# Patient Record
Sex: Male | Born: 1965
Health system: Southern US, Community
[De-identification: ages and names within clinical notes are randomized; demographics above are authoritative.]

## PROBLEM LIST (undated history)

## (undated) DIAGNOSIS — J342 Deviated nasal septum: Secondary | ICD-10-CM

## (undated) DIAGNOSIS — G4730 Sleep apnea, unspecified: Secondary | ICD-10-CM

## (undated) DIAGNOSIS — M1990 Unspecified osteoarthritis, unspecified site: Secondary | ICD-10-CM

## (undated) DIAGNOSIS — K219 Gastro-esophageal reflux disease without esophagitis: Secondary | ICD-10-CM

## (undated) HISTORY — PX: WISDOM TOOTH EXTRACTION: SHX21

## (undated) HISTORY — DX: Sleep apnea, unspecified: G47.30

## (undated) HISTORY — PX: NASAL SEPTUM SURGERY: SHX37

## (undated) HISTORY — DX: Unspecified osteoarthritis, unspecified site: M19.90

## (undated) HISTORY — DX: Gastro-esophageal reflux disease without esophagitis: K21.9

---

## 2005-05-30 ENCOUNTER — Encounter: Admission: RE | Admit: 2005-05-30 | Discharge: 2005-05-30 | Payer: Self-pay | Admitting: Emergency Medicine

## 2007-06-24 ENCOUNTER — Encounter: Admission: RE | Admit: 2007-06-24 | Discharge: 2007-06-24 | Payer: Self-pay | Admitting: Emergency Medicine

## 2009-07-04 ENCOUNTER — Ambulatory Visit: Payer: Self-pay | Admitting: Internal Medicine

## 2009-07-04 ENCOUNTER — Encounter: Payer: Self-pay | Admitting: Internal Medicine

## 2009-07-04 DIAGNOSIS — K219 Gastro-esophageal reflux disease without esophagitis: Secondary | ICD-10-CM | POA: Insufficient documentation

## 2009-07-12 ENCOUNTER — Telehealth: Payer: Self-pay | Admitting: Internal Medicine

## 2009-11-22 ENCOUNTER — Ambulatory Visit: Payer: Self-pay | Admitting: Internal Medicine

## 2009-11-22 DIAGNOSIS — N529 Male erectile dysfunction, unspecified: Secondary | ICD-10-CM | POA: Insufficient documentation

## 2009-11-22 LAB — CONVERTED CEMR LAB
ALT: 31 units/L (ref 0–53)
Alkaline Phosphatase: 45 units/L (ref 39–117)
Basophils Absolute: 0 10*3/uL (ref 0.0–0.1)
Basophils Relative: 0.3 % (ref 0.0–3.0)
Bilirubin Urine: NEGATIVE
Bilirubin, Direct: 0.2 mg/dL (ref 0.0–0.3)
Cholesterol: 206 mg/dL — ABNORMAL HIGH (ref 0–200)
GFR calc non Af Amer: 85.75 mL/min (ref >60)
HCT: 47.5 % (ref 39.0–52.0)
Lymphocytes Relative: 19.8 % (ref 12.0–46.0)
Lymphs Abs: 1.1 10*3/uL (ref 0.7–4.0)
MCV: 102.3 fL — ABNORMAL HIGH (ref 78.0–100.0)
Neutro Abs: 4 10*3/uL (ref 1.4–7.7)
Neutrophils Relative %: 69.4 % (ref 43.0–77.0)
Platelets: 162 10*3/uL (ref 150.0–400.0)
RBC: 4.64 M/uL (ref 4.22–5.81)
RDW: 12.1 % (ref 11.5–14.6)
Specific Gravity, Urine: 1.015 (ref 1.000–1.030)
Total Bilirubin: 1.2 mg/dL (ref 0.3–1.2)
Total Protein, Urine: NEGATIVE mg/dL
Total Protein: 7.3 g/dL (ref 6.0–8.3)
Urobilinogen, UA: 0.2 (ref 0.0–1.0)
WBC: 5.7 10*3/uL (ref 4.5–10.5)
pH: 6.5 (ref 5.0–8.0)

## 2009-11-23 ENCOUNTER — Encounter: Payer: Self-pay | Admitting: Internal Medicine

## 2009-12-27 ENCOUNTER — Ambulatory Visit: Payer: Self-pay | Admitting: Internal Medicine

## 2009-12-27 DIAGNOSIS — B029 Zoster without complications: Secondary | ICD-10-CM | POA: Insufficient documentation

## 2010-02-21 ENCOUNTER — Ambulatory Visit: Payer: Self-pay | Admitting: Internal Medicine

## 2010-02-24 ENCOUNTER — Ambulatory Visit: Payer: Self-pay | Admitting: Internal Medicine

## 2010-05-19 ENCOUNTER — Ambulatory Visit: Payer: Self-pay | Admitting: Internal Medicine

## 2010-07-28 ENCOUNTER — Ambulatory Visit: Payer: Self-pay | Admitting: Internal Medicine

## 2010-08-01 ENCOUNTER — Encounter: Payer: Self-pay | Admitting: Internal Medicine

## 2010-08-03 ENCOUNTER — Telehealth: Payer: Self-pay | Admitting: Internal Medicine

## 2010-09-18 ENCOUNTER — Ambulatory Visit: Payer: Self-pay | Admitting: Internal Medicine

## 2011-01-02 NOTE — Assessment & Plan Note (Signed)
Summary: knot on neck/cd   Vital Signs:  Patient profile:   45 year old male Height:      70 inches Weight:      177 pounds O2 Sat:      98 % on Room air Temp:     98.0 degrees F oral Pulse rate:   66 / minute Pulse rhythm:   regular Resp:     16 per minute BP sitting:   130 / 88  (left arm) Cuff size:   large  Vitals Entered By: Rock Nephew CMA (December 27, 2009 10:02 AM)  O2 Flow:  Room air  Primary Care Provider:  Etta Grandchild MD   History of Present Illness: He returns c/o a 5 day hx. of painful, burning blisters on his chin down to the right side under his right jaw with fatigue and lymphadenopathy in the same area.  Preventive Screening-Counseling & Management  Alcohol-Tobacco     Alcohol drinks/day: <1     Alcohol type: spirits     >5/day in last 3 mos: no     Alcohol Counseling: not indicated; use of alcohol is not excessive or problematic     Feels need to cut down: no     Feels annoyed by complaints: no     Feels guilty re: drinking: no     Needs 'eye opener' in am: no     Smoking Status: never  Hep-HIV-STD-Contraception     Hepatitis Risk: no risk noted     HIV Risk: no risk noted     STD Risk: no risk noted     SBE monthly: yes     SBE Education/Counseling: to perform regular SBE      Sexual History:  currently monogamous.        Drug Use:  never.        Blood Transfusions:  no.    Medications Prior to Update: 1)  Prilosec 2)  Cialis 20 Mg Tabs (Tadalafil) .... Take One By Mouth Q 3-4 Days As Directed  Current Medications (verified): 1)  Prilosec 2)  Cialis 20 Mg Tabs (Tadalafil) .... Take One By Mouth Q 3-4 Days As Directed  Allergies (verified): No Known Drug Allergies  Past History:  Past Medical History: Reviewed history from 07/04/2009 and no changes required. GERD  Past Surgical History: Reviewed history from 07/04/2009 and no changes required. Denies surgical history  Family History: Reviewed history from 07/04/2009  and no changes required. Family History of Arthritis  Social History: Reviewed history from 07/04/2009 and no changes required. 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  The patient denies anorexia, fever, weight loss, chest pain, headaches, and abdominal pain.   General:  Complains of chills, fatigue, and malaise; denies fever, loss of appetite, sweats, weakness, and weight loss.  Physical Exam  General:  alert, well-developed, well-nourished, well-hydrated, appropriate dress, normal appearance, healthy-appearing, cooperative to examination, and good hygiene.   Head:  normocephalic, atraumatic, no abnormalities observed, and no abnormalities palpated.   Eyes:  No corneal or conjunctival inflammation noted. EOMI. Perrla. Funduscopic exam benign, without hemorrhages, exudates or papilledema. Vision grossly normal. Ears:  R ear normal and L ear normal.   Nose:  External nasal examination shows no deformity or inflammation. Nasal mucosa are pink and moist without lesions or exudates. Mouth:  Oral mucosa  and oropharynx without lesions or exudates.  Teeth in good repair. Neck:  he has mild right sided sub-mandibular LAD. Lungs:  Normal respiratory effort, chest expands symmetrically. Lungs are clear to auscultation, no crackles or wheezes. Heart:  Normal rate and regular rhythm. S1 and S2 normal without gallop, murmur, click, rub or other extra sounds. Abdomen:  soft, non-tender, normal bowel sounds, no distention, no masses, no guarding, no hepatomegaly, and no splenomegaly.   Msk:  normal ROM, no joint tenderness, no joint swelling, no joint warmth, no redness over joints, no joint deformities, no joint instability, and no crepitation.   Pulses:  R and L carotid,radial,femoral,dorsalis pedis and posterior tibial pulses are full  and equal bilaterally Extremities:  No clubbing, cyanosis, edema, or deformity noted with normal full range of motion of all joints.   Neurologic:  No cranial nerve deficits noted. Station and gait are normal. Plantar reflexes are down-going bilaterally. DTRs are symmetrical throughout. Sensory, motor and coordinative functions appear intact. Skin:  there are 6 excoriated vesicles with an erythematous base on the right side of the chin extending under the right sub-mandibular region with a faint yellow crust on 3 of the lesions. there is no induration, fluctuance, exudate, streaking, or pustules. Cervical Nodes:  no anterior cervical adenopathy and no posterior cervical adenopathy.   Axillary Nodes:  no R axillary adenopathy and no L axillary adenopathy.   Psych:  Cognition and judgment appear intact. Alert and cooperative with normal attention span and concentration. No apparent delusions, illusions, hallucinations   Impression & Recommendations:  Problem # 1:  IMPETIGO (ICD-684) Assessment New start bactroban to the area, report any worsening to me  Problem # 2:  HERPES ZOSTER (ICD-053.9) Assessment: New start acyclovir, educated pt. about recurrence and contagiosity and long term risk of recurrence  Complete Medication List: 1)  Prilosec  2)  Cialis 20 Mg Tabs (Tadalafil) .... Take one by mouth q 3-4 days as directed 3)  Acyclovir 800 Mg Tabs (Acyclovir) .... One by mouth three times a day for 10 days 4)  Bactroban 2 % Oint (Mupirocin) .... Apply to aa two times a day for 10 days  Patient Instructions: 1)  Please schedule a follow-up appointment in 2 weeks. Prescriptions: BACTROBAN 2 % OINT (MUPIROCIN) Apply to AA two times a day for 10 days  #60 gms x 2   Entered and Authorized by:   Etta Grandchild MD   Signed by:   Etta Grandchild MD on 12/27/2009   Method used:   [facility] to        [facility] [phone]* (retail)       [address]       [place], [place]  [address]       Ph: [phone] or [phone]       Fax: [phone]   RxID:   [id number] ACYCLOVIR 800 MG TABS (ACYCLOVIR) One by mouth three times a day for 10 days  #30 x 2   Entered and Authorized by:   Etta Grandchild MD   Signed by:   Etta Grandchild MD on 12/27/2009   Method used:   [facility] to        [facility] [phone]* (retail)       [address]       [place], [place]  [address]  Ph: [phone] or [phone]       Fax: [phone]   RxID:   [id number]

## 2011-01-02 NOTE — Assessment & Plan Note (Signed)
Summary: DIARRHEA---STC   Vital Signs:  Patient profile:   45 year old male Height:      70 inches Weight:      175.25 pounds BMI:     25.24 O2 Sat:      97 % on Room air Temp:     98.2 degrees F oral Pulse rate:   71 / minute Pulse rhythm:   regular Resp:     16 per minute BP sitting:   114 / 72  (left arm) Cuff size:   large  Vitals Entered By: Rock Nephew CMA (July 28, 2010 10:15 AM)  Nutrition Counseling: Patient's BMI is greater than 25 and therefore counseled on weight management options.  O2 Flow:  Room air CC: diarrhea Is Patient Diabetic? No Pain Assessment Patient in pain? no        Primary Care Provider:  Etta Grandchild MD  CC:  diarrhea.  History of Present Illness:  Diarrhea      This is a 45 year old man who presents with Diarrhea.  The symptoms began 4-8 weeks ago.  The severity is described as mild.  The patient reports 4-6 stools per day, watery/unformed stools, voluminous stools, and gradual onset of symptoms, but denies blood in stool, mucus in stool, greasy stools, malodorous stools, fecal urgency, fecal soiling, alternating diarrhea/constipation, nocturnal diarrhea, fasting diarrhea, bloating, gassiness, and abrupt onset of symptoms.  Associated symptoms include nausea.  The patient denies fever, abdominal pain, abdominal cramps, vomiting, lightheadedness, increased thirst, weight loss, joint pains, mouth ulcers, and eye redness.  The symptoms are worse with specific foods.  The symptoms are better with hypomotility agents.  Patient's risk factors for diarrhea include recent antibiotic use.    Dyspepsia History:      The patient has positive alarm features of dyspepsia which include history of anemia.  There is a prior history of GERD.  The patient does not have a prior history of documented ulcer disease.  The dominant symptom is heartburn or acid reflux.  An H-2 blocker medication is currently being taken.  He notes that the symptoms have improved  with the H-2 blocker therapy.  Symptoms have not persisted after 4 weeks of H-2 blocker treatment.    Preventive Screening-Counseling & Management  Alcohol-Tobacco     Alcohol drinks/day: <1     Alcohol type: spirits     >5/day in last 3 mos: no     Alcohol Counseling: not indicated; use of alcohol is not excessive or problematic     Feels need to cut down: no     Feels annoyed by complaints: no     Feels guilty re: drinking: no     Needs 'eye opener' in am: no     Smoking Status: never  Hep-HIV-STD-Contraception     Hepatitis Risk: no risk noted     HIV Risk: no risk noted     STD Risk: no risk noted     SBE monthly: yes     SBE Education/Counseling: to perform regular SBE      Sexual History:  currently monogamous.        Drug Use:  never.        Blood Transfusions:  no.    Medications Prior to Update: 1)  Cialis 20 Mg Tabs (Tadalafil) .... Take One By Mouth Q 3-4 Days As Directed 2)  Cortisporin 3.5-10000-1 Soln (Neomycin-Polymyxin-Hc) .... 2 Gtts Left Ear Four Times Per Day For 10 Days 3)  Ciprofloxacin Hcl  500 Mg Tabs (Ciprofloxacin Hcl) .Marland Kitchen.. 1po Two Times A Day  Current Medications (verified): 1)  Cialis 20 Mg Tabs (Tadalafil) .... Take One By Mouth Q 3-4 Days As Directed 2)  Prilosec Otc 20 Mg Tbec (Omeprazole Magnesium) .... Once Daily Prn 3)  Metronidazole 500 Mg Tabs (Metronidazole) .... 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 Family History of Arthritis  Social History: Reviewed history from 07/04/2009 and no changes required. 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    Physical Exam  General:  alert, well-developed, well-nourished, well-hydrated, appropriate dress, normal appearance, healthy-appearing, and cooperative to examination.   Head:  normocephalic, atraumatic, no abnormalities observed, and no abnormalities palpated.   Eyes:  No corneal or conjunctival inflammation noted. EOMI. Perrla. Funduscopic exam benign, without hemorrhages, exudates or papilledema. Vision grossly normal. Ears:  R ear normal and L ear normal.   Nose:  External nasal examination shows no deformity or inflammation. Nasal mucosa are pink and moist without lesions or exudates. Mouth:  Oral mucosa and oropharynx without lesions or exudates.  Teeth in good repair. Neck:  supple, full ROM, no masses, no thyromegaly, no JVD, normal carotid upstroke, no carotid bruits, no cervical lymphadenopathy, and no neck tenderness.   Lungs:  normal respiratory effort, no intercostal retractions, no accessory muscle use, normal breath sounds, no dullness, no fremitus, no crackles, and no wheezes.   Heart:  normal rate, regular rhythm, no murmur, no gallop, and no rub.   Abdomen:  soft, non-tender, normal bowel sounds, no distention, no masses, no guarding, no rigidity, no rebound tenderness, no abdominal hernia, no inguinal hernia, no hepatomegaly, and no splenomegaly.   Skin:  he has diffuse psoriatic  plaques as before with no changes. Cervical Nodes:  no anterior cervical adenopathy and no posterior cervical adenopathy.   Axillary Nodes:  no R axillary adenopathy and no L axillary adenopathy.   Inguinal Nodes:  no R inguinal adenopathy and no L inguinal adenopathy.   Psych:  Cognition and judgment appear intact. Alert and cooperative with normal attention span and concentration. No apparent delusions, illusions, hallucinations   Impression & Recommendations:  Problem # 1:  DIARRHEA OF PRESUMED INFECTIOUS ORIGIN (ICD-009.3) Assessment New  Since he was taking Cipro when the diarrhea started I doubt he has a routine bacterial pathogen but I do think he is high risk for C. diff infection so will test his stool and start Flagyl Orders: T-Culture, Stool ([id number]) T-Culture, C-Diff Toxin A/B ([id number]) T-Stool for O&P ([id number]) T-Stool Giardia / Crypto- EIA (29562)  Discussed symptom control and diet. Call if worsening of symptoms or signs of dehydration.   Problem # 2:  GERD (ICD-530.81) Assessment: Unchanged  His updated medication  list for this problem includes:    Prilosec Otc 20 Mg Tbec (Omeprazole magnesium) ..... Once daily prn  Complete Medication List: 1)  Cialis 20 Mg Tabs (Tadalafil) .... Take one by mouth q 3-4 days as directed 2)  Prilosec Otc 20 Mg Tbec (Omeprazole magnesium) .... Once daily prn 3)  Metronidazole 500 Mg Tabs (Metronidazole) .... One by mouth three times a day for 10 days   Patient Instructions: 1)  Please schedule a follow-up appointment in 2 weeks. 2)  Take your antibiotic as prescribed until ALL of it is gone, but stop if you develop a rash or swelling and contact our office as soon as possible. 3)  teh main problem with gastroenteritis is dehydration. Drink plenty of fluids and take solids as you feel better. If you are unable to keep anything down and/or you show signs of dehydration(dry/cracked lips, lack of tears, not urinating,  very sleepy), call our office. Prescriptions: METRONIDAZOLE 500 MG TABS (METRONIDAZOLE) One by mouth three times a day for 10 days  #30 x 1   Entered and Authorized by:   Etta Grandchild MD   Signed by:   Etta Grandchild MD on 07/28/2010   Method used:   [facility]ically to        [facility] [phone]* (retail)       [address]       [place], [place]  [address]       Ph: [phone] or [phone]       Fax: [phone]   RxID:   [id number]

## 2011-01-02 NOTE — Assessment & Plan Note (Signed)
Summary: COLD/NWS   Vital Signs:  Patient profile:   45 year old male Height:      70 inches Weight:      176.25 pounds BMI:     25.38 O2 Sat:      98 % on Room air Temp:     98.5 degrees F oral Pulse rate:   73 / minute BP sitting:   116 / 84  (left arm) Cuff size:   regular  Vitals Entered ByZella Ball Ewing (February 21, 2010 3:31 PM)  O2 Flow:  Room air CC: cough, nasal congestion/RE   Primary Care Provider:  Etta Grandchild MD  CC:  cough and nasal congestion/RE.  History of Present Illness: here with acute onset x 2 to 3 days fever, general weakness, malaise, slight ST and minor nasal congestion but also increasingly prod cough adn mild chest tightness and wheezing, with mild sob.  Does not affect ambualtion ability, no missed work, chills, dizziness, or prostration. Pt denies CP,  orthopnea, pnd, worsening LE edema, palps, dizziness or syncope   Problems Prior to Update: 1)  Headache  (ICD-784.0) 2)  Wheezing  (ICD-786.07) 3)  Bronchitis-acute  (ICD-466.0) 4)  Impetigo  (ICD-684) 5)  Herpes Zoster  (ICD-053.9) 6)  Erectile Dysfunction, Organic  (ICD-607.84) 7)  Routine General Medical Exam@[facility]  (ICD-V70.0) 8)  Gerd  (ICD-530.81) 9)  Shoulder Pain, Right  (ICD-719.41)  Medications Prior to Update: 1)  Prilosec 2)  Cialis 20 Mg Tabs (Tadalafil) .... Take One By Mouth Q 3-4 Days As Directed 3)  Acyclovir 800 Mg Tabs (Acyclovir) .... One By Mouth Three Times A Day For 10 Days 4)  Bactroban 2 % Oint (Mupirocin) .... Apply To Aa Two Times A Day For 10 Days  Current Medications (verified): 1)  Prilosec 2)  Cialis 20 Mg Tabs (Tadalafil) .... Take One By Mouth Q 3-4 Days As Directed 3)  Acyclovir 800 Mg Tabs (Acyclovir) .... One By Mouth Three Times A Day For 10 Days 4)  Bactroban 2 % Oint (Mupirocin) .... Apply To Aa Two Times A Day For 10 Days 5)  Azithromycin 250 Mg Tabs (Azithromycin) .... 2po Qd For 1 Day, Then 1po Qd For 4days, Then Stop 6)  Prednisone 10  Mg Tabs (Prednisone) .... 3po Qd For 3days, Then 2po Qd For 3days, Then 1po Qd For 3days, Then Stop 7)  Hydrocodone-Homatropine 5-1.5 Mg/45ml Syrp (Hydrocodone-Homatropine) .Marland Kitchen.. 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  , mild ill  Head:  normocephalic and atraumatic.   Eyes:  vision grossly intact, pupils equal, and pupils round.   Ears:  bilat tm's mild erythema, sinus nontender Nose:  nasal dischargemucosal pallor and mucosal edema.   Mouth:  pharyngeal erythema and fair dentition.   Neck:  supple and cervical lymphadenopathy.   Lungs:  normal respiratory effort, R decreased breath sounds, and L decreased breath sounds.  ,  no rales Heart:  normal rate and regular rhythm.   Extremities:  no edema, no erythema    Impression & Recommendations:  Problem # 1:  BRONCHITIS-ACUTE (ICD-466.0)  His updated medication list for this problem includes:    Avelox 400 Mg Tabs (Moxifloxacin hcl) .Marland Kitchen... 1 by mouth once daily    Hydrocodone-homatropine 5-1.5 Mg/14ml Syrp (Hydrocodone-homatropine) .Marland Kitchen... 1 tsp by mouth q 6 hrs as needed treat as above, f/u any worsening signs or symptoms   Problem # 2:  WHEEZING (ICD-786.07) early mild , likely related to above, for prednisone burst and taper off   Complete Medication List: 1)  Prilosec  2)  Cialis 20 Mg Tabs (Tadalafil)  .... Take one by mouth q 3-4 days as directed 3)  Acyclovir 800 Mg Tabs (Acyclovir) .... One by mouth three times a day for 10 days 4)  Bactroban 2 % Oint (Mupirocin) .... Apply to aa two times a day for 10 days 5)  Avelox 400 Mg Tabs (Moxifloxacin hcl) .Marland Kitchen.. 1 by mouth once daily 6)  Prednisone 10 Mg Tabs (Prednisone) .... 3po qd for 3days, then 2po qd for 3days, then 1po qd for 3days, then stop 7)  Hydrocodone-homatropine 5-1.5 Mg/30ml Syrp (Hydrocodone-homatropine) .Marland Kitchen.. 1 tsp by mouth q 6 hrs as needed  Patient Instructions: 1)  Please take all new medications as prescribed 2)  Continue all previous medications as before this visit  3)  Please schedule an appointment with your primary doctor as needed: Prescriptions: HYDROCODONE-HOMATROPINE 5-1.5 MG/5ML SYRP (HYDROCODONE-HOMATROPINE) 1 tsp by mouth q 6 hrs as needed  #6oz x 1   Entered and Authorized by:   Corwin Levins MD   Signed by:   Corwin Levins MD on 02/21/2010   Method used:   Print then Give to Patient   RxID:   [id number] PREDNISONE 10 MG TABS (PREDNISONE) 3po qd for 3days, then 2po qd for 3days, then 1po qd for 3days, then stop  #18 x 0   Entered and Authorized by:   Corwin Levins MD   Signed by:   Corwin Levins MD on 02/21/2010   Method used:   Print then Give to Patient   RxID:   [id number] AZITHROMYCIN 250 MG TABS (AZITHROMYCIN) 2po qd for 1 day, then 1po qd for 4days, then stop  #6 x 1   Entered and Authorized by:   Corwin Levins MD   Signed by:   Corwin Levins MD on 02/21/2010   Method used:   Print then Give to Patient   RxID:   [id number]

## 2011-01-02 NOTE — Assessment & Plan Note (Signed)
Summary: ear ache--stc   Vital Signs:  Patient profile:   45 year old male Height:      70 inches Weight:      174.25 pounds BMI:     25.09 O2 Sat:      97 % on Room air Temp:     97.3 degrees F oral Pulse rate:   74 / minute BP sitting:   122 / 78  (left arm) Cuff size:   regular  Vitals Entered By: Margaret Pyle, CMA (May 19, 2010 4:11 PM)  O2 Flow:  Room air CC: LT ear pain x 2 days (Swimmer's ear?) Is Patient Diabetic? No   Primary Care Provider:  Etta Grandchild MD  CC:  LT ear pain x 2 days (Swimmer's ear?).  History of Present Illness: here with acute onset moderate to severe left earache with headache and fever, mild general weakness and malaise;  but no hearing loss, vertigo or dizziness, ST, cough and Pt denies CP, sob, doe, wheezing, orthopnea, pnd, worsening LE edema, palps, dizziness or syncope   Pt denies new neuro symptoms such as headache, facial or extremity weakness   Problems Prior to Update: 1)  Otitis Externa, Left  (ICD-380.10) 2)  Headache  (ICD-784.0) 3)  Wheezing  (ICD-786.07) 4)  Impetigo  (ICD-684) 5)  Herpes Zoster  (ICD-053.9) 6)  Erectile Dysfunction, Organic  (ICD-607.84) 7)  Routine General Medical Exam[facility]  (ICD-V70.0) 8)  Gerd  (ICD-530.81) 9)  Shoulder Pain, Right  (ICD-719.41)  Medications Prior to Update: 1)  Prilosec 2)  Cialis 20 Mg Tabs (Tadalafil) .... Take One By Mouth Q 3-4 Days As Directed 3)  Acyclovir 800 Mg Tabs (Acyclovir) .... One By Mouth Three Times A Day For 10 Days 4)  Bactroban 2 % Oint (Mupirocin) .... Apply To Aa Two Times A Day For 10 Days 5)  Avelox 400 Mg Tabs (Moxifloxacin Hcl) .Marland Kitchen.. 1 By Mouth Once Daily 6)  Prednisone 10 Mg Tabs (Prednisone) .... 3po Qd For 3days, Then 2po Qd For 3days, Then 1po Qd For 3days, Then Stop 7)  Hydrocodone-Homatropine 5-1.5 Mg/73ml Syrp (Hydrocodone-Homatropine) .Marland Kitchen.. 1 Tsp By Mouth Q 6 Hrs As Needed  Current Medications (verified): 1)  Cialis 20 Mg Tabs  (Tadalafil) .... Take One By Mouth Q 3-4 Days As Directed 2)  Cortisporin 3.5-10000-1 Soln (Neomycin-Polymyxin-Hc) .... 2 Gtts Left Ear Four Times Per Day For 10 Days 3)  Ciprofloxacin Hcl 500 Mg Tabs (Ciprofloxacin Hcl) .Marland Kitchen.. 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   Head:  normocephalic and atraumatic.   Eyes:  vision grossly intact, pupils equal, and pupils round.   Ears:  right tm and canal normal;  left canal mod to severe erythema, swollen with mild mucous d/c Nose:  no external deformity and no nasal discharge.   Mouth:  no gingival abnormalities and pharynx pink and moist.   Neck:  supple and no masses.  Lungs:  normal respiratory effort and normal breath sounds.   Heart:  normal rate and regular rhythm.   Extremities:  no edema, no erythema    Impression & Recommendations:  Problem # 1:  OTITIS EXTERNA, LEFT (ICD-380.10)  mod to severe - for oral and otic tx  His updated medication list for this problem includes:    Cortisporin 3.5-10000-1 Soln (Neomycin-polymyxin-hc) .Marland Kitchen... 2 gtts left ear four times per day for 10 days  Complete Medication List: 1)  Cialis 20 Mg Tabs (Tadalafil) .... Take one by mouth q 3-4 days as directed 2)  Cortisporin 3.5-10000-1 Soln (Neomycin-polymyxin-hc) .... 2 gtts left ear four times per day for 10 days 3)  Ciprofloxacin Hcl 500 Mg  Tabs (Ciprofloxacin hcl) .Marland Kitchen.. 1po two times a day  Patient Instructions: 1)  Please take all new medications as prescribed 2)  Continue all previous medications as before this visit  3)  Please schedule a follow-up appointment as needed. Prescriptions: CIPROFLOXACIN HCL 500 MG TABS (CIPROFLOXACIN HCL) 1po two times a day  #20 x 0   Entered and Authorized by:   Corwin Levins MD   Signed by:   Corwin Levins MD on 05/19/2010   Method used:   Print then Give to Patient   RxID:   [id number] CORTISPORIN 3.5-10000-1 SOLN [facility]) 2 gtts left ear four times per day for 10 days  #1 x 0   Entered and Authorized by:   Corwin Levins MD   Signed by:   Corwin Levins MD on 05/19/2010   Method used:   Print then Give to Patient   RxID:   [id number]

## 2011-01-02 NOTE — Assessment & Plan Note (Signed)
Summary: persistant resp inf/jones/cd   Vital Signs:  Patient profile:   45 year old male Height:      70 inches Weight:      182 pounds BMI:     26.21 O2 Sat:      97 % on Room air Temp:     98.8 degrees F oral Pulse rate:   93 / minute BP sitting:   140 / 88  (left arm) Cuff size:   regular  Vitals Entered ByZella Ball Ewing (February 24, 2010 1:49 PM)  O2 Flow:  Room air CC: increased coughing, headache, nasal congestion/RE   Primary Care Provider:  Etta Grandchild MD  CC:  increased coughing, headache, and nasal congestion/RE.  History of Present Illness: here wtih since last visit unfortuanbtlye gradually worsening now mod to severe prod cough, headache, sinus congestion , and today with mild , SOB and wheezing - hard to get deep breaths but Pt denies CP, orthopnea, pnd, worsening LE edema, palps, dizziness or syncope Has some shakes, headache, nausea but no vomiting.  Pt denies new neuro symptoms such as headache, facial or extremity weakness     Problems Prior to Update: 1)  Headache  (ICD-784.0) 2)  Wheezing  (ICD-786.07) 3)  Bronchitis-acute  (ICD-466.0) 4)  Impetigo  (ICD-684) 5)  Herpes Zoster  (ICD-053.9) 6)  Erectile Dysfunction, Organic  (ICD-607.84) 7)  Routine General Medical Exam[facility]  (ICD-V70.0) 8)  Gerd  (ICD-530.81) 9)  Shoulder Pain, Right  (ICD-719.41)  Medications Prior to Update: 1)  Prilosec 2)  Cialis 20 Mg Tabs (Tadalafil) .... Take One By Mouth Q 3-4 Days As Directed 3)  Acyclovir 800 Mg Tabs (Acyclovir) .... One By Mouth Three Times A Day For 10 Days 4)  Bactroban 2 % Oint (Mupirocin) .... Apply To Aa Two Times A Day For 10 Days 5)  Azithromycin 250 Mg Tabs (Azithromycin) .... 2po Qd For 1 Day, Then 1po Qd For 4days, Then Stop 6)  Prednisone 10 Mg Tabs (Prednisone) .... 3po Qd For 3days, Then 2po Qd For 3days, Then 1po Qd For 3days, Then Stop 7)  Hydrocodone-Homatropine 5-1.5 Mg/30ml Syrp (Hydrocodone-Homatropine) .Marland Kitchen.. 1 Tsp By Mouth Q  6 Hrs As Needed  Current Medications (verified): 1)  Prilosec 2)  Cialis 20 Mg Tabs (Tadalafil) .... Take One By Mouth Q 3-4 Days As Directed 3)  Acyclovir 800 Mg Tabs (Acyclovir) .... One By Mouth Three Times A Day For 10 Days 4)  Bactroban 2 % Oint (Mupirocin) .... Apply To Aa Two Times A Day For 10 Days 5)  Avelox 400 Mg Tabs (Moxifloxacin Hcl) .Marland Kitchen.. 1 By Mouth Once Daily 6)  Prednisone 10 Mg Tabs (Prednisone) .... 3po Qd For 3days, Then 2po Qd For 3days, Then 1po Qd For 3days, Then Stop 7)  Hydrocodone-Homatropine 5-1.5 Mg/25ml Syrp (Hydrocodone-Homatropine) .Marland Kitchen.. 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   Eyes:  vision grossly intact, pupils equal, and pupils round.   Ears:  bilat tm's mild red, sinus nontender Nose:  nasal dischargemucosal pallor and mucosal edema.   Mouth:  pharyngeal erythema and fair dentition.   Neck:  supple and no masses.   Lungs:  normal respiratory effort, R decreased breath sounds, R wheezes, L decreased breath sounds, and L wheezes.   - wheezing mild only Heart:  normal rate and regular rhythm.   Extremities:  no edema, no erythema  Neurologic:  cranial nerves II-XII intact and strength normal in all extremities grossly Psych:  slightly anxious.     Impression &  Recommendations:  Problem # 1:  BRONCHITIS-ACUTE (ICD-466.0)  His updated medication list for this problem includes:    Avelox 400 Mg Tabs (Moxifloxacin hcl) .Marland Kitchen... 1 by mouth once daily    Hydrocodone-homatropine 5-1.5 Mg/54ml Syrp (Hydrocodone-homatropine) .Marland Kitchen... 1 tsp by mouth q 6 hrs as needed worse overall per pt - d/c the zpack., start avelox 400 once daily   Orders: Depo- Medrol 40mg  (J1030) Depo- Medrol 80mg  (J1040) Admin of Therapeutic Inj  intramuscular or subcutaneous (16109) T-2 View CXR, Same Day (71020.5TC)  Problem # 2:  WHEEZING (ICD-786.07)  subjective worsening - for depomedrol IM today, finish the prednisone, and add sample symbicort 160  Orders: T-2 View CXR, Same Day (71020.5TC)  Problem # 3:  HEADACHE (ICD-784.0) most liekly due to above, exam bening, pt reasusred, ok to follow  Complete Medication List: 1)  Prilosec  2)  Cialis 20 Mg Tabs (Tadalafil) .... Take one by mouth q 3-4 days as directed 3)  Acyclovir 800 Mg Tabs (Acyclovir) .... One by mouth three times a day for 10 days 4)  Bactroban 2 % Oint (Mupirocin) .... Apply to aa two times a day for 10 days 5)  Avelox 400 Mg Tabs (Moxifloxacin hcl) .Marland Kitchen.. 1 by mouth once daily 6)  Prednisone 10 Mg Tabs (Prednisone) .... 3po qd for 3days, then 2po qd for 3days, then 1po qd for 3days, then stop 7)  Hydrocodone-homatropine 5-1.5 Mg/28ml Syrp (Hydrocodone-homatropine) .Marland Kitchen.. 1 tsp by mouth q 6 hrs as needed  Patient Instructions: 1)  you had the steroid shot today 2)  stop the azithromycin 3)  you can continue the prednisone and cough medicine as prescribed 4)  start the avelox as prescribed 5)  start the symbicort 160/4.5 at 2 puffs twice per day (instructions on use given) 6)  Please go to Radiology in the basement level for your X-Ray today  7)  Please schedule an appointment with your primary doctor as needed Prescriptions: AVELOX 400 MG TABS (MOXIFLOXACIN HCL) 1 by mouth once daily  #10 x 0   Entered and  Authorized by:   Corwin Levins MD   Signed by:   Corwin Levins MD on 02/24/2010   Method used:   Print then Give to Patient   RxID:   [id number]    Medication Administration  Injection # 1:    Medication: Depo- Medrol 40mg     Diagnosis: BRONCHITIS-ACUTE (ICD-466.0)    Route: IM    Site: RUOQ gluteus    Exp Date: 10/2012    Lot #: [id number]    Mfr: Teva    Given by: Zella Ball Ewing (February 24, 2010 2:51 PM)  Injection # 2:    Medication: Depo- Medrol 80mg     Diagnosis: BRONCHITIS-ACUTE (ICD-466.0)    Route: IM    Site: RUOQ gluteus  Exp Date: 10/2012    Lot #: [id number]    Mfr: Teva    Given by: Zella Ball Ewing (February 24, 2010 2:51 PM)  Orders Added: 1)  Depo- Medrol 40mg  [J1030] 2)  Depo- Medrol 80mg  [J1040] 3)  Admin of Therapeutic Inj  intramuscular or subcutaneous [96372] 4)  T-2 View CXR, Same Day [71020.5TC] 5)  Est. Patient Level IV [11914]

## 2011-01-02 NOTE — Assessment & Plan Note (Signed)
Summary: severe sore throat and cough-lb   Vital Signs:  Patient profile:   45 year old male Height:      70 inches Weight:      175 pounds BMI:     25.20 O2 Sat:      97 % on Room air Temp:     98.2 degrees F oral Pulse rate:   74 / minute Pulse rhythm:   regular Resp:     16 per minute BP sitting:   120 / 88  (left arm) Cuff size:   large  Vitals Entered By: Rock Nephew CMA (September 18, 2010 11:11 AM)  Nutrition Counseling: Patient's BMI is greater than 25 and therefore counseled on weight management options.  O2 Flow:  Room air CC: pt c/o cough w/ green-yellow phlem and sore throat x1wk, URI symptoms Is Patient Diabetic? No  Does patient need assistance? Functional Status Self care Ambulation Normal   Primary Care Provider:  Etta Grandchild MD  CC:  pt c/o cough w/ green-yellow phlem and sore throat x1wk and URI symptoms.  History of Present Illness:  URI Symptoms      This is a 45 year old man who presents with URI symptoms.  The symptoms began 5 days ago.  The severity is described as mild.  The patient reports sore throat and productive cough, but denies nasal congestion, clear nasal discharge, purulent nasal discharge, earache, and sick contacts.  Associated symptoms include low-grade fever (<100.5 degrees).  The patient denies stiff neck, dyspnea, wheezing, rash, vomiting, diarrhea, use of an antipyretic, and response to antipyretic.  The patient denies itchy watery eyes, itchy throat, sneezing, headache, muscle aches, and severe fatigue.  The patient denies the following risk factors for Strep sinusitis: unilateral facial pain, unilateral nasal discharge, poor response to decongestant, double sickening, tooth pain, Strep exposure, tender adenopathy, and absence of cough.    Preventive Screening-Counseling & Management  Alcohol-Tobacco     Alcohol drinks/day: <1     Alcohol type: spirits     >5/day in last 3 mos: no     Alcohol Counseling: not indicated; use of  alcohol is not excessive or problematic     Feels need to cut down: no     Feels annoyed by complaints: no     Feels guilty re: drinking: no     Needs 'eye opener' in am: no     Smoking Status: never     Tobacco Counseling: to remain off tobacco products  Hep-HIV-STD-Contraception     Hepatitis Risk: no risk noted     HIV Risk: no risk noted     STD Risk: no risk noted     SBE monthly: yes     SBE Education/Counseling: to perform regular SBE      Sexual History:  currently monogamous.        Drug Use:  never.        Blood Transfusions:  no.    Medications Prior to Update: 1)  Cialis 20 Mg Tabs (Tadalafil) .... Take One By Mouth Q 3-4 Days As Directed 2)  Prilosec Otc 20 Mg Tbec (Omeprazole Magnesium) .... Once Daily Prn  Current Medications (verified): 1)  Cialis 20 Mg Tabs (Tadalafil) .... Take One By Mouth Q 3-4 Days As Directed 2)  Prilosec Otc 20 Mg Tbec (Omeprazole Magnesium) .... Once Daily Prn 3)  Avelox Abc Pack 400 Mg Tabs (Moxifloxacin Hcl) .... One By Mouth Once Daily For 5 Days 4)  Mytussin  Ac 100-10 Mg/69ml Syrp (Guaifenesin-Codeine) .... 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 Family History of Arthritis  Social History: Reviewed history from 07/04/2009 and no changes required. Occupation: Owns  a Architect Divorced Never Smoked Alcohol use-no Drug use-no Regular exercise-yes  Review of Systems  The patient denies anorexia, fever, weight loss, hoarseness, chest pain, syncope, dyspnea on exertion, peripheral edema, headaches, hemoptysis, abdominal pain, suspicious skin lesions, and enlarged lymph nodes.    Physical Exam  General:  alert, well-developed, well-nourished, well-hydrated, appropriate dress, normal appearance, healthy-appearing, and cooperative to examination.   Head:  normocephalic, atraumatic, no abnormalities observed, and no abnormalities palpated.   Ears:  R ear normal and L ear normal.   Nose:  External nasal examination shows no deformity or inflammation. Nasal mucosa are pink and moist without lesions or exudates. Mouth:  Oral mucosa and oropharynx without lesions or exudates.  Teeth in good repair. Neck:  supple, full ROM, no masses, no thyromegaly, no JVD, normal carotid upstroke, no carotid bruits, no cervical lymphadenopathy, and no neck tenderness.   Lungs:  normal respiratory effort, no intercostal retractions, no accessory muscle use, normal breath sounds, no dullness, no fremitus, no crackles, and no wheezes.   Heart:  normal rate, regular rhythm, no murmur, no gallop, no rub, and no JVD.   Abdomen:  soft, non-tender, normal bowel sounds, no distention, no masses, no guarding, no rigidity, no rebound tenderness, no abdominal hernia, no inguinal hernia, no hepatomegaly, and no splenomegaly.   Msk:  No deformity or scoliosis noted of thoracic or lumbar spine.   Pulses:  R and L carotid,radial,femoral,dorsalis pedis and posterior tibial pulses are full and equal bilaterally Extremities:  No clubbing, cyanosis, edema, or deformity noted with normal full range of motion of all joints.   Neurologic:  No cranial nerve deficits noted. Station and gait are normal. Plantar reflexes are down-going bilaterally. DTRs are symmetrical throughout. Sensory, motor and  coordinative functions appear intact. Skin:  turgor normal, color normal, no rashes, no suspicious lesions, no ecchymoses, no petechiae, no purpura, no ulcerations, and no edema.   Cervical Nodes:  no anterior cervical adenopathy and no posterior cervical adenopathy.   Axillary Nodes:  no R axillary adenopathy and no L axillary adenopathy.   Inguinal Nodes:  no R inguinal adenopathy and no L inguinal adenopathy.   Psych:  Cognition and judgment appear intact. Alert and cooperative with normal attention span and concentration. No apparent delusions, illusions, hallucinations   Impression & Recommendations:  Problem # 1:  BRONCHITIS-ACUTE (ICD-466.0) Assessment New  His updated medication list for this problem includes:    Avelox Abc Pack 400 Mg Tabs (Moxifloxacin hcl) ..... One by mouth once daily for 5 days    Mytussin Ac 100-10 Mg/27ml Syrp (Guaifenesin-codeine) .Marland Kitchen... 5-10 ml by mouth qid as needed for cough  Take antibiotics and other medications as directed. Encouraged to push clear liquids, get enough rest, and take acetaminophen  as needed. To be seen in 5-7 days if no improvement, sooner if worse.  Complete Medication List: 1)  Cialis 20 Mg Tabs (Tadalafil) .... Take one by mouth q 3-4 days as directed 2)  Prilosec Otc 20 Mg Tbec (Omeprazole magnesium) .... Once daily prn 3)  Avelox Abc Pack 400 Mg Tabs (Moxifloxacin hcl) .... One by mouth once daily for 5 days 4)  Mytussin Ac 100-10 Mg/4ml Syrp (Guaifenesin-codeine) .... 5-10 ml by mouth qid as needed for cough  Other Orders: Rapid Strep (62952)  Patient Instructions: 1)  Please schedule a follow-up appointment in 1 month. 2)  Take your antibiotic as prescribed until ALL of it is gone, but stop if you develop a rash or swelling and contact our office as soon as possible. 3)  Acute bronchitis symptoms for less than 10 days are not helped by antibiotics. take over the counter cough medications. call if no improvment in  5-7 days,  sooner if increasing cough, fever, or new symptoms( shortness of breath, chest pain). Prescriptions: MYTUSSIN AC 100-10 MG/5ML SYRP (GUAIFENESIN-CODEINE) 5-10 ml by mouth QID as needed for cough  #8 ounces x 0   Entered and Authorized by:   Etta Grandchild MD   Signed by:   Etta Grandchild MD on 09/18/2010   Method used:   Print then Give to Patient   RxID:   [id number] AVELOX ABC PACK 400 MG TABS (MOXIFLOXACIN HCL) One by mouth once daily for 5 days  #5 x 0   Entered and Authorized by:   Etta Grandchild MD   Signed by:   Etta Grandchild MD on 09/18/2010   Method used:   Samples Given   RxID:   [id number]    Orders Added: 1)  Rapid Strep [87880] 2)  Est. Patient Level IV [99214]    Laboratory Results  Date/Time Received: Rock Nephew CMA  September 18, 2010 11:12 AM

## 2011-01-02 NOTE — Progress Notes (Signed)
Summary: ?  Phone Note Call from Patient Call back at [facility] Phone [phone]   Summary of Call: Patient is requesting to know if he can stop antibiotic given his stool studies are normal. He has felt better for a few days. Pt would like to have a beer this weekend and would like to stop med if possible.  Initial call taken by: Lamar Sprinkles, CMA,  August 03, 2010 1:24 PM  Follow-up for Phone Call        yes, he can stop the anitbiotcs Follow-up by: Etta Grandchild MD,  August 03, 2010 1:37 PM  Additional Follow-up for Phone Call Additional follow up Details #1::        Pt informed  Additional Follow-up by: Lamar Sprinkles, CMA,  August 03, 2010 2:08 PM

## 2011-06-21 ENCOUNTER — Encounter: Payer: Self-pay | Admitting: Internal Medicine

## 2011-06-22 ENCOUNTER — Ambulatory Visit (INDEPENDENT_AMBULATORY_CARE_PROVIDER_SITE_OTHER)
Admission: RE | Admit: 2011-06-22 | Discharge: 2011-06-22 | Disposition: A | Discharge status: Home / Self Care | Payer: BC Managed Care – PPO | Source: Ambulatory Visit | Attending: Internal Medicine | Admitting: Internal Medicine

## 2011-06-22 ENCOUNTER — Encounter: Payer: Self-pay | Admitting: Internal Medicine

## 2011-06-22 ENCOUNTER — Ambulatory Visit (INDEPENDENT_AMBULATORY_CARE_PROVIDER_SITE_OTHER): Payer: BC Managed Care – PPO | Admitting: Internal Medicine

## 2011-06-22 VITALS — BP 128/78 | HR 62 | Temp 98.3°F | Resp 16 | Wt 181.0 lb

## 2011-06-22 DIAGNOSIS — S6990XA Unspecified injury of unspecified wrist, hand and finger(s), initial encounter: Secondary | ICD-10-CM

## 2011-06-22 DIAGNOSIS — S6991XA Unspecified injury of right wrist, hand and finger(s), initial encounter: Secondary | ICD-10-CM

## 2011-06-22 NOTE — Assessment & Plan Note (Signed)
I will check a plain film today and I have given his pt ed material about hand injuries, he wants to continue taking aleve for pain

## 2011-06-22 NOTE — Progress Notes (Signed)
  Subjective:    Patient ID: Chase Oneal, male    DOB: 05/05/1966, 45 y.o.   MRN: [id number]  Hand Injury  The incident occurred more than 1 week ago. The incident occurred at home. The injury mechanism was a fall. The pain is present in the right hand. The quality of the pain is described as aching. The pain does not radiate. The pain is at a severity of 3/10. The pain is moderate. The pain has been constant since the incident. Pertinent negatives include no chest pain, muscle weakness, numbness or tingling. The symptoms are aggravated by movement. He has tried NSAIDs for the symptoms. The treatment provided moderate relief.      Review of Systems  Constitutional: Negative.   HENT: Negative.   Eyes: Negative.   Respiratory: Negative.   Cardiovascular: Negative for chest pain.  Gastrointestinal: Negative.   Genitourinary: Negative.   Musculoskeletal: Positive for arthralgias (right hand at the base of the thumb and in the IP joint). Negative for myalgias, back pain, joint swelling and gait problem.  Neurological: Negative.  Negative for tingling and numbness.  Hematological: Negative.   Psychiatric/Behavioral: Negative.        Objective:   Physical Exam  Vitals reviewed. Constitutional: He is oriented to person, place, and time. He appears well-developed and well-nourished. No distress.  HENT:  Head: Normocephalic and atraumatic.  Eyes: Conjunctivae and EOM are normal. Pupils are equal, round, and reactive to light.  Neck: Normal range of motion. Neck supple.  Cardiovascular: Normal rate, normal heart sounds and intact distal pulses.   Pulmonary/Chest: Effort normal and breath sounds normal. He has no wheezes. He has no rales. He exhibits no tenderness.  Abdominal: Soft. Bowel sounds are normal. He exhibits no distension and no mass. There is no tenderness. There is no rebound and no guarding.  Musculoskeletal: Normal range of motion. He exhibits tenderness (in the right thumb).  He exhibits no edema.       Right hand: Normal. He exhibits normal range of motion, normal capillary refill, no deformity and no laceration. Normal strength noted.  Neurological: He is alert and oriented to person, place, and time. He has normal reflexes. He displays normal reflexes. No cranial nerve deficit. He exhibits normal muscle tone. Coordination normal.  Skin: Skin is warm and dry. No rash noted. He is not diaphoretic. No erythema. No pallor.  Psychiatric: He has a normal mood and affect. His behavior is normal. Judgment and thought content normal.          Assessment & Plan:

## 2011-06-22 NOTE — Patient Instructions (Signed)
Hand Injuries  You have an injured hand.  Minor fractures, sprains, bruises and burns of the hand are often managed in much the same way. Treatment includes:   Keep your hand elevated above the level of your heart for the next few days until the pain and swelling improve.    Hand dressings and splints are used to reduce motion, relieve pain and prevent re-injury. Do not remove your dressing and splint until your doctor approves.    Apply ice packs for 20-30 minutes every few hours for 2-3 days to reduce pain and swelling due to fractures, sprains, and deep bruises.    Medicine to reduce pain and inflammation is often helpful.   Early motion exercises are sometimes needed to reduce joint stiffness after a hand injury; however, do not use your hand for any activities that increase pain.    Please see your doctor for follow-up care as advised.  Document Released: 12/27/2004 Document Re-Released: 02/13/2010  [employer] Patient Information 2011 [employer].

## 2012-05-06 ENCOUNTER — Encounter: Payer: Self-pay | Admitting: Internal Medicine

## 2012-05-06 ENCOUNTER — Ambulatory Visit (INDEPENDENT_AMBULATORY_CARE_PROVIDER_SITE_OTHER): Payer: BC Managed Care – PPO | Admitting: Internal Medicine

## 2012-05-06 VITALS — BP 130/88 | HR 69 | Temp 97.3°F | Ht 70.0 in | Wt 171.2 lb

## 2012-05-06 DIAGNOSIS — K4090 Unilateral inguinal hernia, without obstruction or gangrene, not specified as recurrent: Secondary | ICD-10-CM

## 2012-05-06 DIAGNOSIS — K4020 Bilateral inguinal hernia, without obstruction or gangrene, not specified as recurrent: Secondary | ICD-10-CM | POA: Insufficient documentation

## 2012-05-06 DIAGNOSIS — J069 Acute upper respiratory infection, unspecified: Secondary | ICD-10-CM | POA: Insufficient documentation

## 2012-05-06 MED ORDER — HYDROCODONE-HOMATROPINE 5-1.5 MG/5ML PO SYRP: 5.0000 mL | ORAL_SOLUTION | Freq: Four times a day (QID) | ORAL | Status: AC | PRN

## 2012-05-06 MED ORDER — LEVOFLOXACIN 500 MG PO TABS: 500.0000 mg | ORAL_TABLET | Freq: Every day | ORAL | Status: AC

## 2012-05-06 NOTE — Patient Instructions (Signed)
Take all new medications as prescribed Continue all other medications as before You will be contacted regarding the referral for: general surgury 

## 2012-05-06 NOTE — Assessment & Plan Note (Signed)
With ? Bronchitis - Mild to mod, for antibx course,  to f/u any worsening symptoms or concerns

## 2012-05-07 ENCOUNTER — Telehealth: Payer: Self-pay

## 2012-05-07 MED ORDER — AZITHROMYCIN 250 MG PO TABS: ORAL_TABLET | ORAL | Status: AC

## 2012-05-07 NOTE — Telephone Encounter (Signed)
Called the patient informed of medication change. 

## 2012-05-07 NOTE — Telephone Encounter (Signed)
Pt called stating Levaquin is causing stomach upset, nausea and dizziness. Pt is requesting an alternative ABX, please advise.

## 2012-05-07 NOTE — Telephone Encounter (Signed)
Done per erx 

## 2012-05-11 ENCOUNTER — Encounter: Payer: Self-pay | Admitting: Internal Medicine

## 2012-05-11 NOTE — Progress Notes (Signed)
  Subjective:    Patient ID: Chase Oneal, male    DOB: Feb 19, 1966, 46 y.o.   MRN: [id number]  HPI  Here with acute onset mild to mod 2-3 days ST, HA, sinus congestion, general weakness and malaise, with prod cough greenish sputum, but Pt denies chest pain, increased sob or doe, wheezing, orthopnea, PND, increased LE swelling, palpitations, dizziness or syncope. Also has worsening size and discomfort with left inguinal are with swelling, wonders if might be a hernia. Past Medical History  Diagnosis Date  . GERD (gastroesophageal reflux disease)    No past surgical history on file.  reports that he has never smoked. He does not have any smokeless tobacco history on file. He reports that he does not drink alcohol or use illicit drugs. family history includes Arthritis in an unspecified family member. No Known Allergies Current Outpatient Prescriptions on File Prior to Visit  Medication Sig Dispense Refill  . omeprazole (PRILOSEC OTC) 20 MG tablet Take 20 mg by mouth daily.        . tadalafil (CIALIS) 20 MG tablet Take 20 mg by mouth. Take one by mouth every 3 - 4 days as directed         Current Outpatient Prescriptions on File Prior to Visit  Medication Sig Dispense Refill  . omeprazole (PRILOSEC OTC) 20 MG tablet Take 20 mg by mouth daily.        . tadalafil (CIALIS) 20 MG tablet Take 20 mg by mouth. Take one by mouth every 3 - 4 days as directed         Review of Systems All otherwise neg per pt     Objective:   Physical Exam BP 130/88  Pulse 69  Temp(Src) 97.3 F (36.3 C) (Oral)  Ht 5\' 10"  (1.778 m)  Wt 171 lb 4 oz (77.678 kg)  BMI 24.57 kg/m2  SpO2 97% Physical Exam  VS noted, mild ill Constitutional: Pt appears well-developed and well-nourished.  HENT: Head: Normocephalic.  Right Ear: External ear normal.  Left Ear: External ear normal.  Bilat tm's mild erythema.  Sinus nontender.  Pharynx mild erythema Eyes: Conjunctivae and EOM are normal. Pupils are equal, round,  and reactive to light.  Neck: Normal range of motion. Neck supple.  Cardiovascular: Normal rate and regular rhythm.   Pulmonary/Chest: Effort normal and breath sounds normal.  Abd:  Soft, NT, + BS, has left inguinal hernia, mild to mod, reducible, mild tender Neurological: Pt is alert. Skin: Skin is warm. No erythema.  Psychiatric: Pt behavior is normal. Thought content normal.     Assessment & Plan:

## 2012-05-11 NOTE — Assessment & Plan Note (Signed)
Mild, reducible, for gen surgury referral, pt educated

## 2012-05-21 ENCOUNTER — Encounter (INDEPENDENT_AMBULATORY_CARE_PROVIDER_SITE_OTHER): Payer: Self-pay | Admitting: Surgery

## 2012-05-21 ENCOUNTER — Ambulatory Visit (INDEPENDENT_AMBULATORY_CARE_PROVIDER_SITE_OTHER): Payer: BC Managed Care – PPO | Admitting: Surgery

## 2012-05-21 VITALS — BP 142/88 | HR 62 | Temp 98.6°F | Ht 68.0 in | Wt 171.0 lb

## 2012-05-21 DIAGNOSIS — K429 Umbilical hernia without obstruction or gangrene: Secondary | ICD-10-CM | POA: Insufficient documentation

## 2012-05-21 DIAGNOSIS — K4020 Bilateral inguinal hernia, without obstruction or gangrene, not specified as recurrent: Secondary | ICD-10-CM

## 2012-05-21 NOTE — Progress Notes (Signed)
Subjective:     Patient ID: Chase Oneal, male   DOB: March 03, 1966, 46 y.o.   MRN: [id number]  HPI  Chase Oneal  11-09-1966 [id number]  Patient Care Team: Etta Grandchild, MD as PCP - General  This patient is a 46 y.o.male who presents today for surgical evaluation at the request of Dr. Oliver Barre.   Reason for visit: Left renal hernia.  Patient is a pleasant active male. 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 Moderately active with that. He is noticed a left groin bulge for over 7 years. Not been particularly bothersome until this past year when it has gotten larger and more sensitive. He brought up to his primary care physician. There is concern of an inguinal hernia. He was sent to [place] for surgical evaluation.  Patient has daily bowel movements. Rather active. No heart problems. No prior abdominal surgeries. Some mild psoriasis but controlled with dietary changes. No skin infections.  Patient Active Problem List  Diagnosis  . HERPES ZOSTER  . GERD  . ERECTILE DYSFUNCTION, ORGANIC  . Injury of right hand  . Left inguinal hernia  . URI (upper respiratory infection)    Past Medical History  Diagnosis Date  . GERD (gastroesophageal reflux disease)     Past Surgical History  Procedure Date  . Nasal septum surgery 20 yrs ago    History   Social History  . Marital Status: Divorced    Spouse Name: N/A    Number of Children: N/A  . Years of Education: N/A   Occupational History  . owns a Architect    Social History Main Topics  . Smoking status: Never Smoker   . Smokeless tobacco: Not on file  . Alcohol Use: 3.6 oz/week    6 Glasses of wine per week     weekly  . Drug Use: No  . Sexually Active: Not on file   Other Topics Concern  . Not on file   Social History Narrative   Regular exercise-yes    Family History  Problem Relation Age of Onset  . Arthritis      No current outpatient prescriptions on file.     No Known Allergies  BP 142/88  Pulse 62   Temp 98.6 F (37 C) (Temporal)  Ht 5\' 8"  (1.727 m)  Wt 171 lb (77.565 kg)  BMI 26.00 kg/m2  SpO2 97%  No results found.   Review of Systems  Constitutional: Negative for fever, chills and diaphoresis.  HENT: Negative for nosebleeds, sore throat, facial swelling, mouth sores, trouble swallowing and ear discharge.   Eyes: Negative for photophobia, discharge and visual disturbance.  Respiratory: Negative for choking, chest tightness, shortness of breath and stridor.   Cardiovascular: Negative for chest pain and palpitations.  Gastrointestinal: Negative for nausea, vomiting, abdominal pain, diarrhea, constipation, blood in stool, abdominal distention, anal bleeding and rectal pain.  Genitourinary: Negative for dysuria, urgency, difficulty urinating and testicular pain.  Musculoskeletal: Negative for myalgias, back pain, arthralgias and gait problem.  Skin: Negative for color change, pallor, rash and wound.  Neurological: Negative for dizziness, speech difficulty, weakness, numbness and headaches.  Hematological: Negative for adenopathy. Does not bruise/bleed easily.  Psychiatric/Behavioral: Negative for hallucinations, confusion and agitation.       Objective:   Physical Exam  Constitutional: He is oriented to person, place, and time. He appears well-developed and well-nourished. No distress.  HENT:  Head: Normocephalic.  Mouth/Throat: Oropharynx is clear and moist. No oropharyngeal exudate.  Eyes: Conjunctivae  and EOM are normal. Pupils are equal, round, and reactive to light. No scleral icterus.  Neck: Normal range of motion. Neck supple. No tracheal deviation present.  Cardiovascular: Normal rate, regular rhythm and intact distal pulses.   Pulmonary/Chest: Effort normal and breath sounds normal. No respiratory distress.  Abdominal: Soft. He exhibits no distension. There is no tenderness. A hernia is present. Hernia confirmed positive in the ventral area, confirmed positive in the  right inguinal area and confirmed positive in the left inguinal area.    Musculoskeletal: Normal range of motion. He exhibits no tenderness.  Lymphadenopathy:    He has no cervical adenopathy.       Right: No inguinal adenopathy present.       Left: No inguinal adenopathy present.  Neurological: He is alert and oriented to person, place, and time. No cranial nerve deficit. He exhibits normal muscle tone. Coordination normal.  Skin: Skin is warm and dry. No rash noted. He is not diaphoretic. No erythema. No pallor.  Psychiatric: He has a normal mood and affect. His behavior is normal. Judgment and thought content normal.       Assessment:     BIH, umb hernia    Plan:     Lap BIH repair.  The umb hernia probably can be repaired primarily only (possible open mesh reinforcement:  The anatomy & physiology of the abdominal wall and pelvic floor was discussed.  The pathophysiology of hernias in the umbilical, inguinal and pelvic region was discussed.  Natural history risks such as progressive enlargement, pain, incarceration & strangulation was discussed.   Contributors to complications such as smoking, obesity, diabetes, prior surgery, etc were discussed.    I feel the risks of no intervention will lead to serious problems that outweigh the operative risks; therefore, I recommended surgery to reduce and repair the hernia.  I explained laparoscopic techniques with possible need for an open approach.  I noted usual use of mesh to patch and/or buttress hernia repair  Risks such as bleeding, infection, abscess, need for further treatment, heart attack, death, and other risks were discussed.  I noted a good likelihood this will help address the problem.   Goals of post-operative recovery were discussed as well.  Possibility that this will not correct all symptoms was explained.  I stressed the importance of low-impact activity, aggressive pain control, avoiding constipation, & not pushing through pain  to minimize risk of post-operative chronic pain or injury. Possibility of reherniation was discussed.  We will work to minimize complications.     An educational handout further explaining the pathology & treatment options was given as well.  Questions were answered.  The patient expresses understanding & wishes to proceed with surgery.

## 2012-05-21 NOTE — Patient Instructions (Signed)
Hernia, Surgical Repair A hernia occurs when an internal organ pushes out through a weak spot in the belly (abdominal) wall muscles. Hernias commonly occur in the groin and around the navel. Hernias often can be pushed back into place (reduced). Most hernias tend to get worse over time. Problems occur when abdominal contents get stuck in the opening (incarcerated hernia). The blood supply gets cut off (strangulated hernia). This is an emergency and needs surgery. Otherwise, hernia repair can be an elective procedure. This means you can schedule this at your convenience when an emergency is not present. Because complications can occur, if you decide to repair the hernia, it is best to do it soon. When it becomes an emergency procedure, there is increased risk of complications after surgery. CAUSES   Heavy lifting.   Obesity.   Prolonged coughing.   Straining to move your bowels.   Hernias can also occur through a cut (incision) by a surgeonafter an abdominal operation.  HOME CARE INSTRUCTIONS Before the repair:  Bed rest is not required. You may continue your normal activities, but avoid heavy lifting (more than 10 pounds) or straining. Cough gently. If you are a smoker, it is best to stop. Even the best hernia repair can break down with the continual strain of coughing.   Do not wear anything tight over your hernia. Do not try to keep it in with an outside bandage or truss. These can damage abdominal contents if they are trapped in the hernia sac.   Eat a normal diet. Avoid constipation. Straining over long periods of time to have a bowel movement will increase hernia size. It also can breakdown repairs. If you cannot do this with diet alone, laxatives or stool softeners may be used.  PRIOR TO SURGERY, SEEK IMMEDIATE MEDICAL CARE IF: You have problems (symptoms) of a trapped (incarcerated) hernia. Symptoms include:  An oral temperature above 102 F (38.9 C) develops, or as your caregiver  suggests.   Increasing abdominal pain.   Feeling sick to your stomach(nausea) and vomiting.   You stop passing gas or stool.   The hernia is stuck outside the abdomen, looks discolored, feels hard, or is tender.   You have any changes in your bowel habits or in the hernia that is unusual for you.  LET YOUR CAREGIVERS KNOW ABOUT THE FOLLOWING:  Allergies.   Medications taken including herbs, eye drops, over the counter medications, and creams.   Use of steroids (by mouth or creams).   Family or personal history of problems with anesthetics or Novocaine.   Possibility of pregnancy, if this applies.   Personal history of blood clots (thrombophlebitis).   Family or personal history of bleeding or blood problems.   Previous surgery.   Other health problems.  BEFORE THE PROCEDURE You should be present 1 hour prior to your procedure, or as directed by your caregiver.  AFTER THE PROCEDURE After surgery, you will be taken to the recovery area. A nurse will watch and check your progress there. Once you are awake, stable, and taking fluids well, you will be allowed to go home as long as there are no problems. Once home, an ice pack (wrapped in a light towel) applied to your operative site may help with discomfort. It may also keep the swelling down. Do not lift anything heavier than 10 pounds (4.55 kilograms). Take showers not baths. Do not drive while taking narcotics. Follow instructions as suggested by your caregiver.  SEEK IMMEDIATE MEDICAL CARE IF: After   surgery:  There is redness, swelling, or increasing pain in the wound.   There is pus coming from the wound.   There is drainage from a wound lasting longer than 1 day.   An unexplained oral temperature above 102 F (38.9 C) develops.   You notice a foul smell coming from the wound or dressing.   There is a breaking open of a wound (edged not staying together) after the sutures have been removed.   You notice increasing  pain in the shoulders (shoulder strap areas).   You develop dizzy episodes or fainting while standing.   You develop persistent nausea or vomiting.   You develop a rash.   You have difficulty breathing.   You develop any reaction or side effects to medications given.  MAKE SURE YOU:   Understand these instructions.   Will watch your condition.   Will get help right away if you are not doing well or get worse.  Document Released: 05/15/2001 Document Revised: 11/08/2011 Document Reviewed: 04/06/2008 [employer] Patient Information 2012 [employer]. 

## 2012-05-28 ENCOUNTER — Ambulatory Visit (INDEPENDENT_AMBULATORY_CARE_PROVIDER_SITE_OTHER): Payer: BC Managed Care – PPO | Admitting: Surgery

## 2012-06-17 DIAGNOSIS — K4020 Bilateral inguinal hernia, without obstruction or gangrene, not specified as recurrent: Secondary | ICD-10-CM

## 2012-06-17 DIAGNOSIS — K429 Umbilical hernia without obstruction or gangrene: Secondary | ICD-10-CM

## 2012-07-02 ENCOUNTER — Encounter (INDEPENDENT_AMBULATORY_CARE_PROVIDER_SITE_OTHER): Payer: Self-pay | Admitting: Surgery

## 2012-07-02 ENCOUNTER — Ambulatory Visit (INDEPENDENT_AMBULATORY_CARE_PROVIDER_SITE_OTHER): Payer: BC Managed Care – PPO | Admitting: Surgery

## 2012-07-02 VITALS — BP 118/66 | HR 64 | Temp 97.1°F | Resp 12 | Ht 69.0 in | Wt 170.2 lb

## 2012-07-02 DIAGNOSIS — K4020 Bilateral inguinal hernia, without obstruction or gangrene, not specified as recurrent: Secondary | ICD-10-CM

## 2012-07-02 DIAGNOSIS — K429 Umbilical hernia without obstruction or gangrene: Secondary | ICD-10-CM

## 2012-07-02 NOTE — Progress Notes (Signed)
Subjective:     Patient ID: Chase Oneal, male   DOB: November 25, 1966, 46 y.o.   MRN: [id number]  HPI  Chase Oneal  1966/05/07 [id number]  Patient Care Team: Etta Grandchild, MD as PCP - General  This patient is a 46 y.o.male who presents today for surgical evaluation.   Procedure: Laparoscopic bilateral inguinal and open primary umbilical hernia repairs 06/17/2012  The patient comes in today feeling well.  Had some moderate bruising of his scrotum and thighs.  That is nearly resolved.  His soreness has gone down.  He's off all pain meds now.  He's back to work.  Still gets tired but image level is coming back.  Urinating fine.  Moving his bowels well.  Some mild soreness on bilateral flanks.  He noted some mild lumpiness around his bellybutton at incisions and wondered if that was going to resolve.  Overall, he feels like she is recovering well and is satisfied how things have gone  Patient Active Problem List  Diagnosis  . HERPES ZOSTER  . GERD  . ERECTILE DYSFUNCTION, ORGANIC  . Injury of right hand  . Bilateral inguinal hernia (BIH), L>>R  . URI (upper respiratory infection)  . Umbilical hernia    Past Medical History  Diagnosis Date  . GERD (gastroesophageal reflux disease)     Past Surgical History  Procedure Date  . Nasal septum surgery 20 yrs ago    History   Social History  . Marital Status: Divorced    Spouse Name: N/A    Number of Children: N/A  . Years of Education: N/A   Occupational History  . owns a Architect    Social History Main Topics  . Smoking status: Never Smoker   . Smokeless tobacco: Never Used  . Alcohol Use: 3.6 oz/week    6 Glasses of wine per week     weekly  . Drug Use: No  . Sexually Active: Not on file   Other Topics Concern  . Not on file   Social History Narrative   Regular exercise-yes    Family History  Problem Relation Age of Onset  . Arthritis      No current outpatient prescriptions on file.     No Known  Allergies  BP 118/66  Pulse 64  Temp 97.1 F (36.2 C) (Temporal)  Resp 12  Ht 5\' 9"  (1.753 m)  Wt 170 lb 3.2 oz (77.202 kg)  BMI 25.13 kg/m2  No results found.   Review of Systems  Constitutional: Negative for fever, chills and diaphoresis.  HENT: Negative for sore throat, trouble swallowing and neck pain.   Eyes: Negative for photophobia and visual disturbance.  Respiratory: Negative for choking and shortness of breath.   Cardiovascular: Negative for chest pain and palpitations.  Gastrointestinal: Negative for nausea, vomiting, abdominal distention, anal bleeding and rectal pain.  Genitourinary: Negative for dysuria, urgency, difficulty urinating and testicular pain.  Musculoskeletal: Negative for myalgias, arthralgias and gait problem.  Skin: Negative for color change and rash.  Neurological: Negative for dizziness, speech difficulty, weakness and numbness.  Hematological: Negative for adenopathy.  Psychiatric/Behavioral: Negative for hallucinations, confusion and agitation.       Objective:   Physical Exam  Constitutional: He is oriented to person, place, and time. He appears well-developed and well-nourished. No distress.  HENT:  Head: Normocephalic.  Mouth/Throat: Oropharynx is clear and moist. No oropharyngeal exudate.  Eyes: Conjunctivae and EOM are normal. Pupils are equal, round, and reactive to  light. No scleral icterus.  Neck: Normal range of motion. No tracheal deviation present.  Cardiovascular: Normal rate, normal heart sounds and intact distal pulses.   Pulmonary/Chest: Effort normal. No respiratory distress.  Abdominal: Soft. He exhibits no distension. There is no tenderness. Hernia confirmed negative in the right inguinal area and confirmed negative in the left inguinal area.       Incisions clean with normal healing ridges.  No hernias  Musculoskeletal: Normal range of motion. He exhibits no tenderness.  Neurological: He is alert and oriented to person,  place, and time. No cranial nerve deficit. He exhibits normal muscle tone. Coordination normal.  Skin: Skin is warm and dry. No rash noted. He is not diaphoretic.  Psychiatric: He has a normal mood and affect. His behavior is normal.       Assessment:     2 weeks s/p lap BIH & primary umb hernia repairs, recovering well    Plan:     Overall I think he is doing quite well.  I noted the ecchymosis/bruising she continued resolve.  Swelling around the incisions is mild and will continue improve.  As long as he continues to improve, followup as needed.  He feels reassured.  Increase activity as tolerated.  Do not push through pain.  Advanced on diet as tolerated. Bowel regimen to avoid problems.  Return to clinic p.r.n. The patient expressed understanding and appreciation

## 2012-07-02 NOTE — Patient Instructions (Addendum)

## 2013-12-03 DIAGNOSIS — K429 Umbilical hernia without obstruction or gangrene: Secondary | ICD-10-CM

## 2013-12-03 HISTORY — DX: Umbilical hernia without obstruction or gangrene: K42.9

## 2013-12-03 HISTORY — PX: HERNIA REPAIR: SHX51

## 2015-12-07 ENCOUNTER — Telehealth: Payer: Self-pay | Admitting: Internal Medicine

## 2015-12-07 NOTE — Telephone Encounter (Signed)
Informed pt on vm - asked to call back to schedule

## 2015-12-07 NOTE — Telephone Encounter (Signed)
appt set

## 2015-12-07 NOTE — Telephone Encounter (Signed)
It has been more than 3 years since he's seen you. Can he re establish with you? Please advise

## 2015-12-07 NOTE — Telephone Encounter (Signed)
yes

## 2015-12-14 ENCOUNTER — Ambulatory Visit (INDEPENDENT_AMBULATORY_CARE_PROVIDER_SITE_OTHER): Payer: BLUE CROSS/BLUE SHIELD | Admitting: Internal Medicine

## 2015-12-14 ENCOUNTER — Encounter: Payer: Self-pay | Admitting: Internal Medicine

## 2015-12-14 ENCOUNTER — Ambulatory Visit (INDEPENDENT_AMBULATORY_CARE_PROVIDER_SITE_OTHER)
Admission: RE | Admit: 2015-12-14 | Discharge: 2015-12-14 | Disposition: A | Discharge status: Home / Self Care | Payer: BLUE CROSS/BLUE SHIELD | Source: Ambulatory Visit | Attending: Internal Medicine | Admitting: Internal Medicine

## 2015-12-14 VITALS — BP 130/98 | HR 72 | Temp 98.5°F | Resp 16 | Ht 69.0 in | Wt 180.0 lb

## 2015-12-14 DIAGNOSIS — J069 Acute upper respiratory infection, unspecified: Secondary | ICD-10-CM | POA: Insufficient documentation

## 2015-12-14 DIAGNOSIS — Z0000 Encounter for general adult medical examination without abnormal findings: Secondary | ICD-10-CM | POA: Diagnosis not present

## 2015-12-14 DIAGNOSIS — R05 Cough: Secondary | ICD-10-CM

## 2015-12-14 DIAGNOSIS — R072 Precordial pain: Secondary | ICD-10-CM | POA: Insufficient documentation

## 2015-12-14 DIAGNOSIS — Z23 Encounter for immunization: Secondary | ICD-10-CM | POA: Diagnosis not present

## 2015-12-14 DIAGNOSIS — Z1211 Encounter for screening for malignant neoplasm of colon: Secondary | ICD-10-CM

## 2015-12-14 DIAGNOSIS — B9789 Other viral agents as the cause of diseases classified elsewhere: Secondary | ICD-10-CM

## 2015-12-14 DIAGNOSIS — R059 Cough, unspecified: Secondary | ICD-10-CM

## 2015-12-14 LAB — FECAL OCCULT BLOOD, GUAIAC: Fecal Occult Blood: NEGATIVE

## 2015-12-14 MED ORDER — HYDROCODONE-HOMATROPINE 5-1.5 MG/5ML PO SYRP: 5.0000 mL | ORAL_SOLUTION | Freq: Three times a day (TID) | ORAL | Status: DC | PRN
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
## 2015-12-14 NOTE — Patient Instructions (Signed)

## 2016-01-11 ENCOUNTER — Other Ambulatory Visit (INDEPENDENT_AMBULATORY_CARE_PROVIDER_SITE_OTHER): Payer: BLUE CROSS/BLUE SHIELD

## 2016-01-11 ENCOUNTER — Ambulatory Visit (INDEPENDENT_AMBULATORY_CARE_PROVIDER_SITE_OTHER): Payer: BLUE CROSS/BLUE SHIELD | Admitting: Internal Medicine

## 2016-01-11 ENCOUNTER — Encounter: Payer: Self-pay | Admitting: Internal Medicine

## 2016-01-11 VITALS — BP 140/88 | HR 73 | Temp 97.7°F | Resp 16 | Ht 69.0 in | Wt 183.0 lb

## 2016-01-11 DIAGNOSIS — Z0000 Encounter for general adult medical examination without abnormal findings: Secondary | ICD-10-CM

## 2016-01-11 DIAGNOSIS — R072 Precordial pain: Secondary | ICD-10-CM | POA: Diagnosis not present

## 2016-01-11 DIAGNOSIS — R7989 Other specified abnormal findings of blood chemistry: Secondary | ICD-10-CM

## 2016-01-11 DIAGNOSIS — G4733 Obstructive sleep apnea (adult) (pediatric): Secondary | ICD-10-CM | POA: Diagnosis not present

## 2016-01-11 LAB — CBC WITH DIFFERENTIAL/PLATELET
BASOS ABS: 0 10*3/uL (ref 0.0–0.1)
Basophils Relative: 0.4 % (ref 0.0–3.0)
EOS PCT: 2.2 % (ref 0.0–5.0)
Eosinophils Absolute: 0.2 10*3/uL (ref 0.0–0.7)
HEMATOCRIT: 45.3 % (ref 39.0–52.0)
Hemoglobin: 15.5 g/dL (ref 13.0–17.0)
LYMPHS PCT: 19.8 % (ref 12.0–46.0)
Lymphs Abs: 1.4 10*3/uL (ref 0.7–4.0)
MCHC: 34.3 g/dL (ref 30.0–36.0)
MCV: 97.2 fl (ref 78.0–100.0)
MONOS PCT: 9.2 % (ref 3.0–12.0)
Monocytes Absolute: 0.6 10*3/uL (ref 0.1–1.0)
NEUTROS ABS: 4.8 10*3/uL (ref 1.4–7.7)
Neutrophils Relative %: 68.4 % (ref 43.0–77.0)
Platelets: 172 10*3/uL (ref 150.0–400.0)
RBC: 4.66 Mil/uL (ref 4.22–5.81)
RDW: 13.3 % (ref 11.5–15.5)
WBC: 7.1 10*3/uL (ref 4.0–10.5)

## 2016-01-11 LAB — LIPID PANEL
CHOL/HDL RATIO: 5
CHOLESTEROL: 217 mg/dL — AB (ref 0–200)
HDL: 42.6 mg/dL (ref >39.00)
NONHDL: 174.75
TRIGLYCERIDES: 363 mg/dL — AB (ref 0.0–149.0)
VLDL: 72.6 mg/dL — ABNORMAL HIGH (ref 0.0–40.0)

## 2016-01-11 LAB — URINALYSIS, ROUTINE W REFLEX MICROSCOPIC
BILIRUBIN URINE: NEGATIVE
HGB URINE DIPSTICK: NEGATIVE
KETONES UR: NEGATIVE
LEUKOCYTES UA: NEGATIVE
NITRITE: NEGATIVE
Specific Gravity, Urine: 1.015 (ref 1.000–1.030)
Total Protein, Urine: NEGATIVE
UROBILINOGEN UA: 0.2 (ref 0.0–1.0)
Urine Glucose: NEGATIVE
pH: 7 (ref 5.0–8.0)

## 2016-01-11 LAB — COMPREHENSIVE METABOLIC PANEL
ALK PHOS: 56 U/L (ref 39–117)
ALT: 32 U/L (ref 0–53)
AST: 25 U/L (ref 0–37)
Albumin: 4.7 g/dL (ref 3.5–5.2)
BILIRUBIN TOTAL: 0.9 mg/dL (ref 0.2–1.2)
BUN: 17 mg/dL (ref 6–23)
CALCIUM: 9.6 mg/dL (ref 8.4–10.5)
CO2: 29 meq/L (ref 19–32)
Chloride: 102 mEq/L (ref 96–112)
Creatinine, Ser: 1.07 mg/dL (ref 0.40–1.50)
GFR: 77.29 mL/min (ref >60.00)
GLUCOSE: 107 mg/dL — AB (ref 70–99)
POTASSIUM: 4.7 meq/L (ref 3.5–5.1)
Sodium: 140 mEq/L (ref 135–145)
Total Protein: 7 g/dL (ref 6.0–8.3)

## 2016-01-11 LAB — TSH: TSH: 2.08 u[IU]/mL (ref 0.35–4.50)

## 2016-01-11 LAB — HIGH SENSITIVITY CRP: CRP HIGH SENSITIVITY: 1.19 mg/L (ref 0.000–5.000)

## 2016-01-11 LAB — PSA: PSA: 0.74 ng/mL (ref 0.10–4.00)

## 2016-01-11 LAB — LDL CHOLESTEROL, DIRECT: LDL DIRECT: 122 mg/dL

## 2016-01-11 NOTE — Progress Notes (Signed)
Pre visit review using our clinic review tool, if applicable. No additional management support is needed unless otherwise documented below in the visit note. 

## 2016-01-11 NOTE — Patient Instructions (Signed)
Sleep Apnea  Sleep apnea is a sleep disorder characterized by abnormal pauses in breathing while you sleep. When your breathing pauses, the level of oxygen in your blood decreases. This causes you to move out of deep sleep and into light sleep. As a result, your quality of sleep is poor, and the system that carries your blood throughout your body (cardiovascular system) experiences stress. If sleep apnea remains untreated, the following conditions can develop:  High blood pressure (hypertension).  Coronary artery disease.  Inability to achieve or maintain an erection (impotence).  Impairment of your thought process (cognitive dysfunction). There are three types of sleep apnea: 1. Obstructive sleep apnea--Pauses in breathing during sleep because of a blocked airway. 2. Central sleep apnea--Pauses in breathing during sleep because the area of the brain that controls your breathing does not send the correct signals to the muscles that control breathing. 3. Mixed sleep apnea--A combination of both obstructive and central sleep apnea. RISK FACTORS The following risk factors can increase your risk of developing sleep apnea:  Being overweight.  Smoking.  Having narrow passages in your nose and throat.  Being of older age.  Being male.  Alcohol use.  Sedative and tranquilizer use.  Ethnicity. Among individuals younger than 35 years, African Americans are at increased risk of sleep apnea. SYMPTOMS   Difficulty staying asleep.  Daytime sleepiness and fatigue.  Loss of energy.  Irritability.  Loud, heavy snoring.  Morning headaches.  Trouble concentrating.  Forgetfulness.  Decreased interest in sex.  Unexplained sleepiness. DIAGNOSIS  In order to diagnose sleep apnea, your caregiver will perform a physical examination. A sleep study done in the comfort of your own home may be appropriate if you are otherwise healthy. Your caregiver may also recommend that you spend the  night in a sleep lab. In the sleep lab, several monitors record information about your heart, lungs, and brain while you sleep. Your leg and arm movements and blood oxygen level are also recorded. TREATMENT The following actions may help to resolve mild sleep apnea:  Sleeping on your side.   Using a decongestant if you have nasal congestion.   Avoiding the use of depressants, including alcohol, sedatives, and narcotics.   Losing weight and modifying your diet if you are overweight. There also are devices and treatments to help open your airway:  Oral appliances. These are custom-made mouthpieces that shift your lower jaw forward and slightly open your bite. This opens your airway.  Devices that create positive airway pressure. This positive pressure "splints" your airway open to help you breathe better during sleep. The following devices create positive airway pressure:  Continuous positive airway pressure (CPAP) device. The CPAP device creates a continuous level of air pressure with an air pump. The air is delivered to your airway through a mask while you sleep. This continuous pressure keeps your airway open.  Nasal expiratory positive airway pressure (EPAP) device. The EPAP device creates positive air pressure as you exhale. The device consists of single-use valves, which are inserted into each nostril and held in place by adhesive. The valves create very little resistance when you inhale but create much more resistance when you exhale. That increased resistance creates the positive airway pressure. This positive pressure while you exhale keeps your airway open, making it easier to breath when you inhale again.  Bilevel positive airway pressure (BPAP) device. The BPAP device is used mainly in patients with central sleep apnea. This device is similar to the CPAP device because   it also uses an air pump to deliver continuous air pressure through a mask. However, with the BPAP machine, the  pressure is set at two different levels. The pressure when you exhale is lower than the pressure when you inhale.  Surgery. Typically, surgery is only done if you cannot comply with less invasive treatments or if the less invasive treatments do not improve your condition. Surgery involves removing excess tissue in your airway to create a wider passage way.   This information is not intended to replace advice given to you by your health care provider. Make sure you discuss any questions you have with your health care provider.   Document Released: 11/09/2002 Document Revised: 12/10/2014 Document Reviewed: 03/27/2012 Elsevier Interactive Patient Education 2016 [employer] Inc[employer]   

## 2016-01-11 NOTE — Progress Notes (Signed)
Subjective:  Patient ID: Chase Oneal, male    DOB: October 25, 1966  Age: 50 y.o. MRN: [id number]  CC: Follow-up   HPI Chase Oneal presents for follow-up, will I last saw him he had upper respiratory infection symptoms with cough and chest pain. Those symptoms have resolved. He was also seen for a physical but did not get his lab work done. He agrees to do his labs today. He also tells me that his girlfriend says that he has sleep apnea with heavy snoring and witnessed episodes of gasping for air. He agrees to have a sleep study done.  Outpatient Prescriptions Prior to Visit  Medication Sig Dispense Refill  . PROAIR HFA 108 (90 Base) MCG/ACT inhaler INHALE 2 PUFFS AS NEEDED EVERY 4 HOURS FOR 10 DAYS  0  . HYDROcodone-homatropine (HYCODAN) 5-1.5 MG/5ML syrup Take 5 mLs by mouth every 8 (eight) hours as needed for cough. 120 mL 0   No facility-administered medications prior to visit.    ROS Review of Systems  Constitutional: Negative.  Negative for fever, chills, diaphoresis, appetite change and fatigue.  HENT: Negative.   Eyes: Negative.   Respiratory: Positive for apnea. Negative for cough, choking, chest tightness, shortness of breath and stridor.   Cardiovascular: Negative.  Negative for chest pain, palpitations and leg swelling.  Gastrointestinal: Negative.  Negative for abdominal pain.  Endocrine: Negative.   Genitourinary: Negative.   Musculoskeletal: Negative.   Skin: Negative.   Allergic/Immunologic: Negative.   Neurological: Negative.  Negative for dizziness, tremors, speech difficulty, weakness, light-headedness and numbness.  Hematological: Negative.  Negative for adenopathy. Does not bruise/bleed easily.  Psychiatric/Behavioral: Negative.     Objective:  BP 140/88 mmHg  Pulse 73  Temp(Src) 97.7 F (36.5 C) (Oral)  Resp 16  Ht  (1.753 m)  Wt 183 lb (83.008 kg)  BMI 27.01 kg/m2  SpO2 97%  BP Readings from Last 3 Encounters:  01/11/16 140/88  12/14/15  130/98  07/02/12 118/66    Wt Readings from Last 3 Encounters:  01/11/16 183 lb (83.008 kg)  12/14/15 180 lb (81.647 kg)  07/02/12 170 lb 3.2 oz (77.202 kg)    Physical Exam  Constitutional: He is oriented to person, place, and time. He appears well-developed and well-nourished. No distress.  HENT:  Head: Normocephalic and atraumatic.  Mouth/Throat: Oropharynx is clear and moist. No oropharyngeal exudate.  Eyes: Conjunctivae are normal. Right eye exhibits no discharge. Left eye exhibits no discharge. No scleral icterus.  Neck: Normal range of motion. Neck supple. No JVD present. No tracheal deviation present. No thyromegaly present.  Cardiovascular: Normal rate, regular rhythm, normal heart sounds and intact distal pulses.  Exam reveals no gallop and no friction rub.   No murmur heard. Pulmonary/Chest: Effort normal and breath sounds normal. No stridor. No respiratory distress. He has no wheezes. He has no rales. He exhibits no tenderness.  Abdominal: Soft. Bowel sounds are normal. He exhibits no distension and no mass. There is no tenderness. There is no rebound and no guarding.  Musculoskeletal: Normal range of motion. He exhibits no edema or tenderness.  Lymphadenopathy:    He has no cervical adenopathy.  Neurological: He is oriented to person, place, and time.  Skin: Skin is warm and dry. No rash noted. He is not diaphoretic. No erythema. No pallor.  Psychiatric: He has a normal mood and affect. His behavior is normal. Judgment and thought content normal.  Vitals reviewed.   Lab Results  Component Value Date  WBC 7.1 01/11/2016   HGB 15.5 01/11/2016   HCT 45.3 01/11/2016   PLT 172.0 01/11/2016   GLUCOSE 107* 01/11/2016   CHOL 217* 01/11/2016   TRIG 363.0* 01/11/2016   HDL 42.60 01/11/2016   LDLDIRECT 122.0 01/11/2016   ALT 32 01/11/2016   AST 25 01/11/2016   NA 140 01/11/2016   K 4.7 01/11/2016   CL 102 01/11/2016   CREATININE 1.07 01/11/2016   BUN 17 01/11/2016    CO2 29 01/11/2016   TSH 2.08 01/11/2016   PSA 0.74 01/11/2016    Dg Chest 2 View  12/14/2015  CLINICAL DATA:  Cough, left anterior chest pain and shortness of breath for 1 month EXAM: CHEST  2 VIEW COMPARISON:  05/30/2015 FINDINGS: The heart size and vascular pattern are normal. The right lung is clear. There is minimal atelectasis at the left lung base. There are no pleural effusions. IMPRESSION: No significant acute findings Electronically Signed   By: Esperanza Heir M.D.   On: 12/14/2015 15:48    Assessment & Plan:   [name] was seen today for follow-up.  Diagnoses and all orders for this visit:  OSA (obstructive sleep apnea) -     Ambulatory referral to Pulmonology  Routine general medical examination at a health care facility -     Lipid panel; Future   I have discontinued Mr. Troung HYDROcodone-homatropine. I am also having him maintain his PROAIR HFA.  No orders of the defined types were placed in this encounter.     Follow-up: Return if symptoms worsen or fail to improve.  [name], MD

## 2016-01-12 ENCOUNTER — Encounter: Payer: Self-pay | Admitting: Internal Medicine

## 2016-01-12 LAB — HEPATITIS C ANTIBODY: HCV AB: NEGATIVE

## 2016-02-02 ENCOUNTER — Encounter: Payer: Self-pay | Admitting: Pulmonary Disease

## 2016-02-02 ENCOUNTER — Ambulatory Visit (INDEPENDENT_AMBULATORY_CARE_PROVIDER_SITE_OTHER): Payer: BLUE CROSS/BLUE SHIELD | Admitting: Pulmonary Disease

## 2016-02-02 ENCOUNTER — Encounter (INDEPENDENT_AMBULATORY_CARE_PROVIDER_SITE_OTHER): Payer: Self-pay

## 2016-02-02 VITALS — BP 142/84 | HR 68 | Temp 98.5°F | Ht 70.0 in | Wt 182.0 lb

## 2016-02-02 DIAGNOSIS — J42 Unspecified chronic bronchitis: Secondary | ICD-10-CM | POA: Insufficient documentation

## 2016-02-02 DIAGNOSIS — G4733 Obstructive sleep apnea (adult) (pediatric): Secondary | ICD-10-CM

## 2016-02-02 DIAGNOSIS — G4710 Hypersomnia, unspecified: Secondary | ICD-10-CM

## 2016-02-02 DIAGNOSIS — K219 Gastro-esophageal reflux disease without esophagitis: Secondary | ICD-10-CM

## 2016-02-02 DIAGNOSIS — R059 Cough, unspecified: Secondary | ICD-10-CM

## 2016-02-02 DIAGNOSIS — R05 Cough: Secondary | ICD-10-CM | POA: Diagnosis not present
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
## 2016-02-02 NOTE — Assessment & Plan Note (Signed)
Mild GERD. On reflux medicines.

## 2016-02-02 NOTE — Patient Instructions (Signed)
1. We will schedule you for home sleep study. 2. We will call you with results of the home sleep study. If it is positive, we will order a CPAP machine. 3. Call the office if you having issues with his CPAP.  Return to clinic in 2 months.

## 2016-02-02 NOTE — Assessment & Plan Note (Signed)
Mild cough. Gets bronchitis 1-2 times a year. Better. We'll hold off on treatment. Chest x-ray in January 2017 -- no acute changes. The image was personally reviewed.

## 2016-02-02 NOTE — Assessment & Plan Note (Signed)
Patient has chronic snoring, over 20 years. Has hypersomnia, gasping, choking, witnessed apneas. Symptoms have gotten worse. Hypersomnia affects functionality. He does a lot of traveling. Recently on a plane, he had witnessed apneas and had chest pain after that. His mother has sleep apnea and is on CPAP. He has a crowded airway, mild retrognathia. ESS 16. Neck circ 15 inches.  Girlfriend was with him when we discussed about sleep apnea. Plan: 1. Patient will need a home sleep study. If it's mild, may consider not to treat unless patient and girlfriend wants it  treated for snoring. If it's moderate, definitely will treat. 2. If positive sleep study, plan to do an auto CPAP, 5-15 cm water. He has deviated septum. May end up needing full face mask. Patient also has less events on his left side. Positional therapy may also be an option. 3. Advised on good sleep hygiene. 4. Positional therapy may be an option if it's mild. 5. Advised not to drive when sleepy. 6. He does a lot of traveling. Mentioned about travel CPAP.

## 2016-02-13 ENCOUNTER — Other Ambulatory Visit: Payer: Self-pay | Admitting: Pulmonary Disease

## 2016-02-13 DIAGNOSIS — G4710 Hypersomnia, unspecified: Secondary | ICD-10-CM

## 2016-02-13 NOTE — Progress Notes (Signed)
Ins denied HST, SST ordered.

## 2016-04-12 ENCOUNTER — Ambulatory Visit: Payer: BLUE CROSS/BLUE SHIELD | Admitting: Pulmonary Disease

## 2016-04-18 ENCOUNTER — Ambulatory Visit (HOSPITAL_BASED_OUTPATIENT_CLINIC_OR_DEPARTMENT_OTHER): Payer: BLUE CROSS/BLUE SHIELD | Attending: Pulmonary Disease | Admitting: Pulmonary Disease

## 2016-04-18 DIAGNOSIS — G4733 Obstructive sleep apnea (adult) (pediatric): Secondary | ICD-10-CM | POA: Diagnosis not present

## 2016-04-18 DIAGNOSIS — G4710 Hypersomnia, unspecified: Secondary | ICD-10-CM

## 2016-04-19 ENCOUNTER — Telehealth: Payer: Self-pay | Admitting: Pulmonary Disease

## 2016-04-19 DIAGNOSIS — G4733 Obstructive sleep apnea (adult) (pediatric): Secondary | ICD-10-CM

## 2016-04-19 NOTE — Procedures (Signed)
Patient Name: Chase, Oneal Date: 04/18/2016   Gender: Male  D.O.B: 1966/10/26  Age (years): 49  Referring Provider: [place]   Height (inches): 70  Interpreting Physician: [place]   Weight (lbs): 175  RPSGT: Gerhard Perches   BMI: 25  MRN: [id number]  Neck Size: 16.00    CLINICAL INFORMATION  Sleep Study Type: Split Night CPAP  Indication for sleep study: Excessive Daytime Sleepiness, OSA  Epworth Sleepiness Score: 12   SLEEP STUDY TECHNIQUE  As per the AASM Manual for the Scoring of Sleep and Associated Events v2.3 (April 2016) with a hypopnea requiring 4% desaturations.  The channels recorded and monitored were frontal, central and occipital EEG, electrooculogram (EOG), submentalis EMG (chin), nasal and oral airflow, thoracic and abdominal wall motion, anterior tibialis EMG, snore microphone, electrocardiogram, and pulse oximetry. Continuous positive airway pressure (CPAP) was initiated when the patient met split night criteria and was titrated according to treat sleep-disordered breathing.  MEDICATIONS  Medications taken by the patient : Chart review done. Meds reviewed per chart.  Medications administered by patient during sleep study : No sleep medicine administered.   RESPIRATORY PARAMETERS  Diagnostic Total AHI (/hr):  35.0 RDI (/hr): 36.0 OA Index (/hr):  2.5 CA Index (/hr):  1.0  REM AHI (/hr):  0.0 NREM AHI (/hr):  40.2 Supine AHI (/hr):  68.0 Non-supine AHI (/hr):  25.73  Min O2 Sat (%): 74.00 Mean O2 (%): 90.96 Time below 88% (min): 28.5    Titration Optimal Pressure (cm): 8 AHI at Optimal Pressure (/hr): 0.0 Min O2 at Optimal Pressure (%): 91.0  Supine % at Optimal (%): 100 Sleep % at Optimal (%): 100    SLEEP ARCHITECTURE  The recording time for the entire night was 377.0 minutes. During a baseline period of 199.5 minutes, the patient slept for 188.4 minutes in REM and nonREM, yielding a sleep efficiency of 94.4%. Sleep  onset after lights out was 4.1 minutes with a REM latency of 138.5 minutes. The patient spent 3.45% of the night in stage N1 sleep, 70.87% in stage N2 sleep, 12.94% in stage N3 and 12.74% in REM.  During the titration period of 173.5 minutes, the patient slept for 124.5 minutes in REM and nonREM, yielding a sleep efficiency of 71.8%. Sleep onset after CPAP initiation was 3.6 minutes with a REM latency of 66.5 minutes. The patient spent 2.41% of the night in stage N1 sleep, 67.87% in stage N2 sleep, 2.81% in stage N3 and 26.91% in REM.  CARDIAC DATA  The 2 lead EKG demonstrated sinus rhythm. The mean heart rate was 71.91 beats per minute. Other EKG findings include: None.   LEG MOVEMENT DATA  The total Periodic Limb Movements of Sleep (PLMS) were 121. The PLMS index was 23.08 .  IMPRESSIONS  Severe obstructive sleep apnea occurred during the diagnostic portion of the study (AHI = 35.0/hour). An optimal PAP pressure was selected for this patient ( 8 cm of water) No significant central sleep apnea occurred during the diagnostic portion of the study (CAI = 1.0/hour). Mild oxygen desaturation was noted during the diagnostic portion of the study (Min O2 = 74.00%). The patient snored with Moderate snoring volume during the diagnostic portion of the study. No cardiac abnormalities were noted during this study. Moderate periodic limb movements of sleep occurred during the study.   DIAGNOSIS  Obstructive Sleep Apnea (327.23 [G47.33 ICD-10])   RECOMMENDATIONS  Trial of CPAP therapy  on 8 cm H2O with a Medium size Resmed Full Face Mask AirFit F10 mask and heated humidification. Avoid alcohol, sedatives and other CNS depressants that may worsen sleep apnea and disrupt normal sleep architecture. Sleep hygiene should be reviewed to assess factors that may improve sleep quality. Weight management and regular exercise should be initiated or continued. Return to the office 4-6 weeks after obtaining CPAP  machine.  Monica Becton, MD 04/19/2016, 3:11 PM [facility] Pager [phone] After 3 pm or if no answer, call [phone]

## 2016-04-19 NOTE — Telephone Encounter (Signed)
    Please call the pt and tell the pt the LAB  SLEEP STUDY  showed OSA    Pt stops breathing  35  times an hour.   Trial of CPAP therapy on 8 cm H2O with a Medium size Resmed Full Face Mask AirFit F10 mask and heated humidification.   Patient will need a 1 month download.   Patient needs to be seen 4-6 weeks after obtaining the cpap machine. Let me know if you receive this.   Thanks!   J. Alexis FrockAngelo A de Dios, MD 04/19/2016, 3:13 PM

## 2016-04-23 NOTE — Telephone Encounter (Signed)
LMTCB

## 2016-04-24 NOTE — Telephone Encounter (Signed)
Spoke with pt and relayed below.  Pt states he would like to first try oral appliance as that seems the most compatible with his lifestyle, and if he is still having difficulty qhs a cpap could then be considered.  Referral to Dr. Myrtis SerKatz placed.  Nothing further needed.

## 2016-04-24 NOTE — Telephone Encounter (Signed)
Patient notified of Sleep Study results. Patient states that he camps a lot and is out in areas that does not have electricity.  He wanted to know if he would be a candidate for the Oral Appliance instead of CPAP, if CPAP is best for him, he would like to know if he can get both oral for when he is camping or traveling and CPAP for when he is home.  Dr. Christene Slatese Dios, please advise.

## 2016-04-24 NOTE — Telephone Encounter (Signed)
   Hello.  Since pt has severe OSA, If I were him, i will try CPAP therapy which is the first line treatment for severe OSA.  We can refer him to a dentist and see if he can qualify for an oral device if he wants. I am not 100% sure about insurance coverage if he also has CPAP. He can check. We can refer to Dr. Althea GrimmerMark Katz unless his dentist does oral appliance.   There is also a portable CPAP that runs on batteries for people who travel.  He can also check that out.   Thanks.  AD

## 2016-05-09 ENCOUNTER — Telehealth: Payer: Self-pay | Admitting: Pulmonary Disease

## 2016-05-09 NOTE — Telephone Encounter (Signed)
Spoke with Chase Oneal. He was inquiring about his referral to Dr. Myrtis SerKatz. I advised him that per [facility]'s documentation, this referral was completed yesterday. He states that he has not heard from Dr. Henrietta HooverKatz's office, he will call them back to check on this. Nothing further was needed.

## 2016-05-17 ENCOUNTER — Encounter: Payer: Self-pay | Admitting: Pulmonary Disease

## 2016-05-17 ENCOUNTER — Ambulatory Visit (INDEPENDENT_AMBULATORY_CARE_PROVIDER_SITE_OTHER): Payer: BLUE CROSS/BLUE SHIELD | Admitting: Pulmonary Disease

## 2016-05-17 VITALS — BP 118/78 | HR 80 | Ht 70.0 in | Wt 181.0 lb

## 2016-05-17 DIAGNOSIS — G4733 Obstructive sleep apnea (adult) (pediatric): Secondary | ICD-10-CM

## 2016-05-17 NOTE — Patient Instructions (Signed)
  It was a pleasure taking care of you today!  You are diagnosed with Obstructive Sleep Apnea or OSA.  You stop breathing  35 times/hr  Dr. Myrtis SerKatz will follow you up on your sleep apnea with oral mouth piece. .    Return to clinic in as needed.

## 2016-05-17 NOTE — Assessment & Plan Note (Signed)
Patient has chronic snoring, over 20 years. Has hypersomnia, gasping, choking, witnessed apneas. Symptoms have gotten worse. Hypersomnia affects functionality. He does a lot of traveling. Recently on a plane, he had witnessed apneas and had chest pain after that. His mother has sleep apnea and is on CPAP. He has a crowded airway, mild retrognathia. ESS 16. Neck circ 15 inches.  HST (04/2016)  AHI 35x/hr.  Patient does a lot of traveling and camping. He anticipates he will not be able to tolerate CPAP therapy. He has seen Dr. Althea GrimmerMark Katz with regards to oral device.  I discussed with him that CPAP therapy would still be recommended treatment especially with the fact that he has severe sleep apnea. He understands that. I mentioned that he might have issues with insurance coverage of oral device since he has not tried CPAP therapy yet. He will keep me posted. For now, plan is for him to follow-up with Dr. Myrtis SerKatz. I mentioned that on the oral device, his sleep apnea needs to be corrected, i.e.. AHI less than 5.  Follow-up as needed if with issues with oral device.

## 2016-05-17 NOTE — Progress Notes (Signed)
Subjective:     Patient ID: Chase Oneal, male   DOB: 1966-01-18, 50 y.o.   MRN: [id number]  HPI  This is the case of Chase Oneal, 50 y.o. Male, who was referred by Dr. [name] in consultation regarding possible OSA.   As you very well know, patient is a non smoker. Patient denies any history of asthma or COPD. Pt is a chronic snorer. Has been snoring for 20 years or so. Has witnessed apneas, gasping, choking, hypersomnia. Hypersomnia affects functionality. Symptoms have gotten worse. Denies any abnormal behavior and sleep. He has a girlfriend who accompanied him today and she mentioned his symptoms have gotten worse.  He does a lot of traveling. On her recent plane trip, his seat made noticed him stop breathing. She woke him up. Patient also ended up having chest pain during that time. Cardiac workup has been unremarkable.  ROV (05/17/16)  Patient returns to the office as follow-up on his sleep apnea. Since last seen, he had a home sleep study which showed severe sleep apnea with AHI of 35 times an hour. He does a lot of traveling and camping so CPAP he thought he would not be comfortable with. He ended up seeing Dr. Myrtis Ser for oral device/mouthguard. He remains to have symptoms of sleep apnea with hypersomnia, snoring, fatigue. Hypersomnia affects his functionality. Has not been admitted nor been on antibiotics since last seen.   Review of Systems  Constitutional: Negative for fever and unexpected weight change.  HENT: Negative for congestion, dental problem, ear pain, nosebleeds, postnasal drip, rhinorrhea, sinus pressure, sneezing, sore throat and trouble swallowing.   Eyes: Negative for redness and itching.  Respiratory: Positive for apnea. Negative for cough, chest tightness, shortness of breath and wheezing.   Cardiovascular: Positive for palpitations. Negative for leg swelling.  Gastrointestinal: Negative for nausea and vomiting.  Genitourinary: Negative for dysuria.   Musculoskeletal: Negative for joint swelling.  Skin: Negative for rash.  Neurological: Negative for headaches.  Hematological: Does not bruise/bleed easily.  Psychiatric/Behavioral: Negative for dysphoric mood. The patient is not nervous/anxious.        Objective:   Physical Exam Vitals:  Filed Vitals:   05/17/16 1532  BP: 118/78  Pulse: 80  Height:  (1.778 m)  Weight: 181 lb (82.101 kg)  SpO2: 95%    Constitutional/General:  Pleasant, well-nourished, well-developed, not in any distress,  Comfortably seating.  Well kempt  Body mass index is 25.97 kg/(m^2). Wt Readings from Last 3 Encounters:  05/17/16 181 lb (82.101 kg)  04/18/16 175 lb (79.379 kg)  02/02/16 182 lb (82.555 kg)    HEENT: Pupils equal and reactive to light and accommodation. Anicteric sclerae. Normal nasal mucosa.   No oral  lesions,  mouth clear,  oropharynx clear, no postnasal drip. (-) Oral thrush. No dental caries.  Airway - Mallampati class III  Neck: No masses. Midline trachea. No JVD, (-) LAD. (-) bruits appreciated.  Respiratory/Chest: Grossly normal chest. (-) deformity. (-) Accessory muscle use.  Symmetric expansion. (-) Tenderness on palpation.  Resonant on percussion.  Diminished BS on both lower lung zones. (-) wheezing, crackles, rhonchi (-) egophony  Cardiovascular: Regular rate and  rhythm, heart sounds normal, no murmur or gallops, no peripheral edema  Gastrointestinal:  Normal bowel sounds. Soft, non-tender. No hepatosplenomegaly.  (-) masses.   Musculoskeletal:  Normal muscle tone. Normal gait.   Extremities: Grossly normal. (-) clubbing, cyanosis.  (-) edema  Skin: (-) rash,lesions seen.   Neurological/Psychiatric : alert,  oriented to time, place, person. Normal mood and affect          Assessment:     OSA (obstructive sleep apnea) Patient has chronic snoring, over 20 years. Has hypersomnia, gasping, choking, witnessed apneas. Symptoms have gotten worse.  Hypersomnia affects functionality. He does a lot of traveling. Recently on a plane, he had witnessed apneas and had chest pain after that. His mother has sleep apnea and is on CPAP. He has a crowded airway, mild retrognathia. ESS 16. Neck circ 15 inches.  HST (04/2016)  AHI 35x/hr.  Patient does a lot of traveling and camping. He anticipates he will not be able to tolerate CPAP therapy. He has seen Dr. Althea GrimmerMark Katz with regards to oral device.  I discussed with him that CPAP therapy would still be recommended treatment especially with the fact that he has severe sleep apnea. He understands that. I mentioned that he might have issues with insurance coverage of oral device since he has not tried CPAP therapy yet. He will keep me posted. For now, plan is for him to follow-up with Dr. Myrtis SerKatz. I mentioned that on the oral device, his sleep apnea needs to be corrected, i.e.. AHI less than 5.  Follow-up as needed if with issues with oral device.        Plan:        J. Alexis FrockAngelo A. de Dios, MD Pulmonary and Critical Care Medicine [facility] Pager: [phone] Office: [phone], Fax: [phone]

## 2016-05-21 ENCOUNTER — Telehealth: Payer: Self-pay | Admitting: Pulmonary Disease

## 2016-05-21 DIAGNOSIS — G4733 Obstructive sleep apnea (adult) (pediatric): Secondary | ICD-10-CM

## 2016-05-21 NOTE — Telephone Encounter (Signed)
Patient returned Michelle's call. He can be reached at [phone]

## 2016-05-21 NOTE — Telephone Encounter (Signed)
Left message for patient to call back  

## 2016-05-21 NOTE — Telephone Encounter (Signed)
OK to send Rx for autocpap 5-15 cm H2o with heated humidifier and tubings,,mask, filters, cushions.  Thanks.   Do I need to sign the prescription?    J. Alexis FrockAngelo A de Dios, MD 05/21/2016, 4:21 PM [facility] Pager [phone] After 3 pm or if no answer, call [phone]

## 2016-05-21 NOTE — Telephone Encounter (Signed)
See TE 05/21/16

## 2016-05-21 NOTE — Telephone Encounter (Signed)
Patient is leaving to go on vacation on Saturday, patient needs to take a new CPAP machine with him when he leaves.  He is requesting a prescription for a CPAP machine to be faxed to [facility] (information below).  He said that this is not going through insurance, he is buying the machine outright, just needs prescription ASAP.  Patient says that he is having a lot of trouble breathing at night and wants to go ahead and get machine ASAP.  Dr. Clayborn BignesseDios, please advise if okay to give prescription, if so, please advise the settings so we can get the prescription faxed ASAP.

## 2016-05-21 NOTE — Telephone Encounter (Signed)
Rx printed, signed and faxed. Patient notified. Nothing further needed.

## 2016-05-29 DIAGNOSIS — S91012A Laceration without foreign body, left ankle, initial encounter: Secondary | ICD-10-CM | POA: Diagnosis not present

## 2016-07-05 ENCOUNTER — Ambulatory Visit: Payer: BLUE CROSS/BLUE SHIELD | Admitting: Pulmonary Disease

## 2016-12-09 DIAGNOSIS — B349 Viral infection, unspecified: Secondary | ICD-10-CM | POA: Diagnosis not present

## 2016-12-09 DIAGNOSIS — R6889 Other general symptoms and signs: Secondary | ICD-10-CM | POA: Diagnosis not present

## 2016-12-10 DIAGNOSIS — R1114 Bilious vomiting: Secondary | ICD-10-CM | POA: Diagnosis not present

## 2016-12-10 DIAGNOSIS — J40 Bronchitis, not specified as acute or chronic: Secondary | ICD-10-CM | POA: Diagnosis not present

## 2016-12-13 DIAGNOSIS — R030 Elevated blood-pressure reading, without diagnosis of hypertension: Secondary | ICD-10-CM | POA: Diagnosis not present

## 2016-12-13 DIAGNOSIS — J209 Acute bronchitis, unspecified: Secondary | ICD-10-CM | POA: Diagnosis not present

## 2016-12-18 ENCOUNTER — Encounter: Payer: Self-pay | Admitting: Family

## 2016-12-18 ENCOUNTER — Ambulatory Visit (INDEPENDENT_AMBULATORY_CARE_PROVIDER_SITE_OTHER): Payer: BLUE CROSS/BLUE SHIELD | Admitting: Family

## 2016-12-18 DIAGNOSIS — J209 Acute bronchitis, unspecified: Secondary | ICD-10-CM

## 2016-12-18 MED ORDER — LEVOFLOXACIN 500 MG PO TABS: 500.0000 mg | ORAL_TABLET | Freq: Every day | ORAL | 0 refills | Status: DC

## 2016-12-18 MED ORDER — PREDNISONE 20 MG PO TABS: ORAL_TABLET | ORAL | 0 refills | Status: DC

## 2016-12-18 NOTE — Progress Notes (Signed)
Subjective:    Patient ID: Chase Oneal, male    DOB: 1966-06-18, 51 y.o.   MRN: [id number]  Chief Complaint  Patient presents with  . Cough    x1 week, cough and congestion    HPI:  Chase Oneal is a 51 y.o. male who  has a past medical history of GERD (gastroesophageal reflux disease). and presents today for an acute office visit.   This is a new problem. Associated symptoms of cough, congestion, weakness and chills has been going on for about 1 week. Denies fevers. Modifying factors include azithromycin and a steroid injection which has not helped very much. Describes a productive cough that brownish yellow sputum. Continues to have a "coughing fit at times."  Able to sleep at night.    No Known Allergies    Outpatient Medications Prior to Visit  Medication Sig Dispense Refill  . ibuprofen (ADVIL,MOTRIN) 200 MG tablet Per bottle as needed    . PROAIR HFA 108 (90 Base) MCG/ACT inhaler Reported on 02/02/2016  0   No facility-administered medications prior to visit.       Past Surgical History:  Procedure Laterality Date  . NASAL SEPTUM SURGERY  20 yrs ago      Past Medical History:  Diagnosis Date  . GERD (gastroesophageal reflux disease)       Review of Systems  Constitutional: Negative for chills and fever.  HENT: Positive for congestion. Negative for sinus pain, sinus pressure and sore throat.   Respiratory: Positive for cough. Negative for chest tightness, shortness of breath and wheezing.   Neurological: Negative for headaches.      Objective:    BP 122/80 (BP Location: Left Arm, Patient Position: Sitting, Cuff Size: Large)   Pulse 78   Temp 97.9 F (36.6 C) (Oral)   Resp 18   Ht 5\' 10"  (1.778 m)   Wt 179 lb (81.2 kg)   SpO2 96%   BMI 25.68 kg/m  Nursing note and vital signs reviewed.  Physical Exam  Constitutional: He is oriented to person, place, and time. He appears well-developed and well-nourished. No distress.  HENT:  Right Ear:  Hearing, tympanic membrane, external ear and ear canal normal.  Left Ear: Hearing, tympanic membrane, external ear and ear canal normal.  Nose: Nose normal.  Mouth/Throat: Uvula is midline, oropharynx is clear and moist and mucous membranes are normal.  Cardiovascular: Normal rate, regular rhythm, normal heart sounds and intact distal pulses.   Pulmonary/Chest: Effort normal. No respiratory distress. He has wheezes. He has no rales. He exhibits no tenderness.  Neurological: He is alert and oriented to person, place, and time.  Skin: Skin is warm and dry.  Psychiatric: He has a normal mood and affect. His behavior is normal. Judgment and thought content normal.       Assessment & Plan:   Problem List Items Addressed This Visit      Respiratory   Acute bronchitis    Symptoms and exam are consistent with resolving acute bronchitis. Start prednisone taper. Written prescription for levofloxacin provided if symptoms worsen or do not improve. Continue over-the-counter medications as needed for symptom relief and supportive care. Follow-up if symptoms worsen or do not improve.          I have discontinued Mr. Teofilo Lupinacci HFA and ibuprofen. I am also having him start on levofloxacin and predniSONE.   Meds ordered this encounter  Medications  . levofloxacin (LEVAQUIN) 500 MG tablet    Sig:  Take 1 tablet (500 mg total) by mouth daily.    Dispense:  7 tablet    Refill:  0    Order Specific Question:   Supervising Provider    Answer:   Hillard DankerRAWFORD, ELIZABETH A [[id number]]  . predniSONE (DELTASONE) 20 MG tablet    Sig: Take 3 tablets by mouth for 3 days, then 2 tablets by mouth for 3 days and 1 tablet by mouth for 3 days.    Dispense:  18 tablet    Refill:  0    Order Specific Question:   Supervising Provider    Answer:   Hillard DankerRAWFORD, ELIZABETH A [[id number]]     Follow-up: Return if symptoms worsen or fail to improve.  Jeanine Luzalone, Gregory, FNP

## 2016-12-18 NOTE — Assessment & Plan Note (Signed)
Symptoms and exam are consistent with resolving acute bronchitis. Start prednisone taper. Written prescription for levofloxacin provided if symptoms worsen or do not improve. Continue over-the-counter medications as needed for symptom relief and supportive care. Follow-up if symptoms worsen or do not improve.

## 2016-12-18 NOTE — Patient Instructions (Signed)
Thank you for choosing [facility].  SUMMARY AND INSTRUCTIONS:  Medication:  Please start the prednisone.  If your symptoms worsen please start the antibiotic.  Your prescription(s) have been submitted to your pharmacy or been printed and provided for you. Please take as directed and contact our office if you believe you are having problem(s) with the medication(s) or have any questions.  Follow up:  If your symptoms worsen or fail to improve, please contact our office for further instruction, or in case of emergency go directly to the emergency room at the closest medical facility.   General Recommendations:    Please drink plenty of fluids.  Get plenty of rest   Sleep in humidified air  Use saline nasal sprays  Netti pot   OTC Medications:  Decongestants - helps relieve congestion   Flonase (generic fluticasone) or Nasacort (generic triamcinolone) - please make sure to use the "cross-over" technique at a 45 degree angle towards the opposite eye as opposed to straight up the nasal passageway.   Sudafed (generic pseudoephedrine - Note this is the one that is available behind the pharmacy counter); Products with phenylephrine (-PE) may also be used but is often not as effective as pseudoephedrine.   If you have HIGH BLOOD PRESSURE - Coricidin HBP; AVOID any product that is -D as this contains pseudoephedrine which may increase your blood pressure.  Afrin (oxymetazoline) every 6-8 hours for up to 3 days.   Allergies - helps relieve runny nose, itchy eyes and sneezing   Claritin (generic loratidine), Allegra (fexofenidine), or Zyrtec (generic cyrterizine) for runny nose. These medications should not cause drowsiness.  Note - Benadryl (generic diphenhydramine) may be used however may cause drowsiness  Cough -   Delsym or Robitussin (generic dextromethorphan)  Expectorants - helps loosen mucus to ease removal   Mucinex (generic guaifenesin) as directed on  the package.  Headaches / General Aches   Tylenol (generic acetaminophen) - DO NOT EXCEED 3 grams (3,000 mg) in a 24 hour time period  Advil/Motrin (generic ibuprofen)   Sore Throat -   Salt water gargle   Chloraseptic (generic benzocaine) spray or lozenges / Sucrets (generic dyclonine)

## 2017-02-27 DIAGNOSIS — H524 Presbyopia: Secondary | ICD-10-CM | POA: Diagnosis not present

## 2017-02-27 DIAGNOSIS — H5203 Hypermetropia, bilateral: Secondary | ICD-10-CM | POA: Diagnosis not present

## 2017-04-17 ENCOUNTER — Ambulatory Visit (INDEPENDENT_AMBULATORY_CARE_PROVIDER_SITE_OTHER): Payer: BLUE CROSS/BLUE SHIELD | Admitting: Internal Medicine

## 2017-04-17 ENCOUNTER — Encounter: Payer: Self-pay | Admitting: Internal Medicine

## 2017-04-17 VITALS — BP 158/98 | HR 67 | Ht 70.0 in | Wt 179.0 lb

## 2017-04-17 DIAGNOSIS — R062 Wheezing: Secondary | ICD-10-CM

## 2017-04-17 DIAGNOSIS — R059 Cough, unspecified: Secondary | ICD-10-CM

## 2017-04-17 DIAGNOSIS — R05 Cough: Secondary | ICD-10-CM | POA: Diagnosis not present

## 2017-04-17 MED ORDER — PREDNISONE 10 MG PO TABS: ORAL_TABLET | ORAL | 0 refills | Status: DC

## 2017-04-17 MED ORDER — LEVOFLOXACIN 500 MG PO TABS: 500.0000 mg | ORAL_TABLET | Freq: Every day | ORAL | 0 refills | Status: AC

## 2017-04-17 MED ORDER — METHYLPREDNISOLONE ACETATE 80 MG/ML IJ SUSP
80.0000 mg | Freq: Once | INTRAMUSCULAR | Status: AC
  Administered 2017-04-17: 80 mg via INTRAMUSCULAR

## 2017-04-17 MED ORDER — ALBUTEROL SULFATE HFA 108 (90 BASE) MCG/ACT IN AERS: 2.0000 | INHALATION_SPRAY | Freq: Four times a day (QID) | RESPIRATORY_TRACT | 2 refills | Status: DC | PRN

## 2017-04-17 NOTE — Patient Instructions (Signed)
You had the steroid shot today  Please take all new medication as prescribed - the antibiotic, prednisone and inhaler  Please continue all other medications as before, and refills have been done if requested.  Please have the pharmacy call with any other refills you may need.  Please keep your appointments with your specialists as you may have planned

## 2017-04-17 NOTE — Progress Notes (Signed)
   Subjective:    Patient ID: Chase Oneal, male    DOB: 1966/03/27, 51 y.o.   MRN: [id number]  HPI  Here with acute onset mild to mod 2-3 days ST, HA, general weakness and malaise, with prod cough greenish sputum, but Pt denies chest pain, increased sob or doe, wheezing, orthopnea, PND, increased LE swelling, palpitations, dizziness or syncope, excewpt for onset mild wheezing and sob since last PM Past Medical History:  Diagnosis Date  . GERD (gastroesophageal reflux disease)    Past Surgical History:  Procedure Laterality Date  . NASAL SEPTUM SURGERY  20 yrs ago    reports that he has never smoked. He has never used smokeless tobacco. He reports that he drinks about 3.6 oz of alcohol per week . He reports that he does not use drugs. family history includes Arthritis in his mother; Sleep apnea in his mother. No Known Allergies No current outpatient prescriptions on file prior to visit.   No current facility-administered medications on file prior to visit.    Review of Systems  All otherwise neg per pt    Objective:   Physical Exam BP (!) 158/98   Pulse 67   Ht 5\' 10"  (1.778 m)   Wt 179 lb (81.2 kg)   SpO2 100%   BMI 25.68 kg/m  VS noted, mild ill Constitutional: Pt appears in NAD HENT: Head: NCAT.  Right Ear: External ear normal.  Left Ear: External ear normal.  Eyes: . Pupils are equal, round, and reactive to light. Conjunctivae and EOM are normal Bilat tm's with mild erythema.  Max sinus areas mild tender.  Pharynx with mild erythema, no exudate Nose: without d/c or deformity Neck: Neck supple. Gross normal ROM Cardiovascular: Normal rate and regular rhythm.   Pulmonary/Chest: Effort normal and breath sounds without rales but with few scattered bilat wheezing.  Neurological: Pt is alert. At baseline orientation, motor grossly intact Skin: Skin is warm. No rashes, other new lesions, no LE edema Psychiatric: Pt behavior is normal without agitation  No other exam  findings     Assessment & Plan:

## 2017-04-20 DIAGNOSIS — R062 Wheezing: Secondary | ICD-10-CM | POA: Insufficient documentation

## 2017-04-20 NOTE — Assessment & Plan Note (Signed)
Mild to mod, for cough med prn, and predpac asd,  to f/u any worsening symptoms or concerns

## 2017-04-20 NOTE — Assessment & Plan Note (Signed)
Mild to mod, c/w bronchitis vs pna,, declines cxr, for antibx, cough med prn, and f/u any worrsening symptoms or concerns

## 2017-04-24 DIAGNOSIS — L4059 Other psoriatic arthropathy: Secondary | ICD-10-CM | POA: Diagnosis not present

## 2017-04-24 DIAGNOSIS — M2550 Pain in unspecified joint: Secondary | ICD-10-CM | POA: Diagnosis not present

## 2017-05-20 ENCOUNTER — Ambulatory Visit (INDEPENDENT_AMBULATORY_CARE_PROVIDER_SITE_OTHER)
Admission: RE | Admit: 2017-05-20 | Discharge: 2017-05-20 | Disposition: A | Discharge status: Home / Self Care | Payer: BLUE CROSS/BLUE SHIELD | Source: Ambulatory Visit | Attending: Internal Medicine | Admitting: Internal Medicine

## 2017-05-20 ENCOUNTER — Other Ambulatory Visit (INDEPENDENT_AMBULATORY_CARE_PROVIDER_SITE_OTHER): Payer: BLUE CROSS/BLUE SHIELD

## 2017-05-20 ENCOUNTER — Encounter: Payer: Self-pay | Admitting: Internal Medicine

## 2017-05-20 ENCOUNTER — Ambulatory Visit (INDEPENDENT_AMBULATORY_CARE_PROVIDER_SITE_OTHER): Payer: BLUE CROSS/BLUE SHIELD | Admitting: Internal Medicine

## 2017-05-20 VITALS — BP 150/100 | HR 72 | Temp 98.7°F | Resp 16 | Ht 70.0 in | Wt 174.8 lb

## 2017-05-20 DIAGNOSIS — R05 Cough: Secondary | ICD-10-CM

## 2017-05-20 DIAGNOSIS — K2100 Gastro-esophageal reflux disease with esophagitis, without bleeding: Secondary | ICD-10-CM

## 2017-05-20 DIAGNOSIS — R059 Cough, unspecified: Secondary | ICD-10-CM

## 2017-05-20 DIAGNOSIS — I10 Essential (primary) hypertension: Secondary | ICD-10-CM | POA: Diagnosis not present

## 2017-05-20 LAB — URINALYSIS, ROUTINE W REFLEX MICROSCOPIC
BILIRUBIN URINE: NEGATIVE
HGB URINE DIPSTICK: NEGATIVE
Ketones, ur: NEGATIVE
LEUKOCYTES UA: NEGATIVE
NITRITE: NEGATIVE
RBC / HPF: NONE SEEN (ref 0–?)
Specific Gravity, Urine: 1.015 (ref 1.000–1.030)
TOTAL PROTEIN, URINE-UPE24: NEGATIVE
Urine Glucose: NEGATIVE
Urobilinogen, UA: 0.2 (ref 0.0–1.0)
pH: 6.5 (ref 5.0–8.0)

## 2017-05-20 LAB — COMPREHENSIVE METABOLIC PANEL
ALBUMIN: 4.7 g/dL (ref 3.5–5.2)
ALK PHOS: 52 U/L (ref 39–117)
ALT: 27 U/L (ref 0–53)
AST: 18 U/L (ref 0–37)
BILIRUBIN TOTAL: 0.5 mg/dL (ref 0.2–1.2)
BUN: 18 mg/dL (ref 6–23)
CO2: 29 mEq/L (ref 19–32)
CREATININE: 1.08 mg/dL (ref 0.40–1.50)
Calcium: 9.8 mg/dL (ref 8.4–10.5)
Chloride: 101 mEq/L (ref 96–112)
GFR: 76.06 mL/min (ref >60.00)
GLUCOSE: 131 mg/dL — AB (ref 70–99)
Potassium: 4.7 mEq/L (ref 3.5–5.1)
SODIUM: 137 meq/L (ref 135–145)
TOTAL PROTEIN: 7.2 g/dL (ref 6.0–8.3)

## 2017-05-20 LAB — CBC WITH DIFFERENTIAL/PLATELET
Basophils Absolute: 0 10*3/uL (ref 0.0–0.1)
Basophils Relative: 0.1 % (ref 0.0–3.0)
Eosinophils Absolute: 0 10*3/uL (ref 0.0–0.7)
Eosinophils Relative: 0 % (ref 0.0–5.0)
HEMATOCRIT: 47.4 % (ref 39.0–52.0)
Hemoglobin: 16.4 g/dL (ref 13.0–17.0)
Lymphs Abs: 0.4 10*3/uL — ABNORMAL LOW (ref 0.7–4.0)
MCHC: 34.6 g/dL (ref 30.0–36.0)
MCV: 97.8 fl (ref 78.0–100.0)
MONOS PCT: 3.1 % (ref 3.0–12.0)
Monocytes Absolute: 0.3 10*3/uL (ref 0.1–1.0)
NEUTROS ABS: 9.7 10*3/uL — AB (ref 1.4–7.7)
PLATELETS: NORMAL 10*3/uL (ref 150.0–400.0)
RBC: 4.84 Mil/uL (ref 4.22–5.81)
RDW: 12.5 % (ref 11.5–15.5)
WBC: 10.5 10*3/uL (ref 4.0–10.5)

## 2017-05-20 LAB — THYROID PANEL WITH TSH
Free Thyroxine Index: 2.5 (ref 1.4–3.8)
T3 Uptake: 31 % (ref 22–35)
T4 TOTAL: 8.1 ug/dL (ref 4.5–12.0)
TSH: 0.57 mIU/L (ref 0.40–4.50)

## 2017-05-20 LAB — POCT EXHALED NITRIC OXIDE: FeNO level (ppb): 12

## 2017-05-20 MED ORDER — PANTOPRAZOLE SODIUM 40 MG PO TBEC: 40.0000 mg | DELAYED_RELEASE_TABLET | Freq: Every day | ORAL | 1 refills | Status: DC

## 2017-05-20 MED ORDER — AZILSARTAN-CHLORTHALIDONE 40-12.5 MG PO TABS: 1.0000 | ORAL_TABLET | Freq: Every day | ORAL | 0 refills | Status: DC

## 2017-05-20 MED ORDER — HYDROCODONE-HOMATROPINE 5-1.5 MG/5ML PO SYRP: 5.0000 mL | ORAL_SOLUTION | Freq: Three times a day (TID) | ORAL | 0 refills | Status: DC | PRN

## 2017-05-20 NOTE — Progress Notes (Signed)
Subjective:  Patient ID: Chase Oneal, male    DOB: 04-25-1966  Age: 51 y.o. MRN: [id number]  CC: Cough and Hypertension   HPI Chase Oneal presents for Follow-up on chronic, recurrent cough. He has had this for little over a month. The cough is nonproductive and is worse at night while he tries to sleep. He was seen by someone else about a month ago and was prescribed prednisone and albuterol, neither of which have helped. He says the cough is never productive of blood or phlegm. He denies DOE, CP, SOB, palpitations, edema, or fatigue. He does have an occasional headache. He also complains of heartburn and is not treating this.  Outpatient Medications Prior to Visit  Medication Sig Dispense Refill  . albuterol (PROVENTIL HFA;VENTOLIN HFA) 108 (90 Base) MCG/ACT inhaler Inhale 2 puffs into the lungs every 6 (six) hours as needed for wheezing or shortness of breath. 1 Inhaler 2  . predniSONE (DELTASONE) 10 MG tablet 3 tabs by mouth per day for 3 days,2tabs per day for 3 days,1tab per day for 3 days 18 tablet 0   No facility-administered medications prior to visit.     ROS Review of Systems  Constitutional: Negative.  Negative for appetite change, chills, diaphoresis, fatigue, fever and unexpected weight change.  HENT: Negative.  Negative for congestion, facial swelling, postnasal drip, rhinorrhea, sinus pain, sore throat, trouble swallowing and voice change.   Eyes: Negative.   Respiratory: Positive for apnea and cough. Negative for choking, chest tightness, shortness of breath, wheezing and stridor.   Cardiovascular: Negative.  Negative for chest pain, palpitations and leg swelling.  Gastrointestinal: Negative.  Negative for abdominal pain, blood in stool, constipation, diarrhea, nausea and vomiting.  Endocrine: Negative.   Genitourinary: Negative.  Negative for difficulty urinating, dysuria, frequency and urgency.  Musculoskeletal: Negative.  Negative for back pain and neck pain.    Skin: Negative.  Negative for color change and rash.  Allergic/Immunologic: Negative.   Neurological: Positive for headaches. Negative for dizziness, weakness, light-headedness and numbness.  Hematological: Negative.  Negative for adenopathy. Does not bruise/bleed easily.  Psychiatric/Behavioral: Negative.     Objective:  BP (!) 150/100 (BP Location: Left Arm, Patient Position: Sitting, Cuff Size: Normal)   Pulse 72   Temp 98.7 F (37.1 C) (Oral)   Resp 16   Ht 5\' 10"  (1.778 m)   Wt 174 lb 12 oz (79.3 kg)   SpO2 98%   BMI 25.07 kg/m   BP Readings from Last 3 Encounters:  05/20/17 (!) 150/100  04/17/17 (!) 158/98  12/18/16 122/80    Wt Readings from Last 3 Encounters:  05/20/17 174 lb 12 oz (79.3 kg)  04/17/17 179 lb (81.2 kg)  12/18/16 179 lb (81.2 kg)    Physical Exam  Constitutional: He is oriented to person, place, and time.  Non-toxic appearance. He does not have a sickly appearance. He does not appear ill. No distress.  HENT:  Mouth/Throat: No oropharyngeal exudate.  Eyes: Conjunctivae are normal. Right eye exhibits no discharge. No scleral icterus.  Neck: Normal range of motion. Neck supple. No JVD present. No thyromegaly present.  Cardiovascular: Normal rate, regular rhythm and normal heart sounds.  Exam reveals no gallop and no friction rub.   No murmur heard. EKG- Sinus  Rhythm  WITHIN NORMAL LIMITS - No LVH, No ischemia, no old EKG for comparison  Pulmonary/Chest: Effort normal and breath sounds normal. No stridor. No respiratory distress. He has no wheezes. He has no  rales. He exhibits no tenderness.  Abdominal: Soft. Bowel sounds are normal. He exhibits no distension and no mass. There is no tenderness. There is no rebound and no guarding.  Musculoskeletal: He exhibits no edema, tenderness or deformity.  Lymphadenopathy:    He has no cervical adenopathy.  Neurological: He is alert and oriented to person, place, and time.  Skin: Skin is warm and dry. Rash  noted. He is not diaphoretic. No erythema. No pallor.  Mild psoriatic plaques  Vitals reviewed.   Lab Results  Component Value Date   WBC 10.5 05/20/2017   HGB 16.4 05/20/2017   HCT 47.4 05/20/2017   PLT 261.0 Platelet estimate appears normal. 05/20/2017   GLUCOSE 131 (H) 05/20/2017   CHOL 217 (H) 01/11/2016   TRIG 363.0 (H) 01/11/2016   HDL 42.60 01/11/2016   LDLDIRECT 122.0 01/11/2016   ALT 27 05/20/2017   AST 18 05/20/2017   NA 137 05/20/2017   K 4.7 05/20/2017   CL 101 05/20/2017   CREATININE 1.08 05/20/2017   BUN 18 05/20/2017   CO2 29 05/20/2017   TSH 0.57 05/20/2017   PSA 0.74 01/11/2016    Dg Chest 2 View  Result Date: 12/14/2015 CLINICAL DATA:  Cough, left anterior chest pain and shortness of breath for 1 month EXAM: CHEST  2 VIEW COMPARISON:  05/30/2015 FINDINGS: The heart size and vascular pattern are normal. The right lung is clear. There is minimal atelectasis at the left lung base. There are no pleural effusions. IMPRESSION: No significant acute findings Electronically Signed   By: Esperanza Heir M.D.   On: 12/14/2015 15:48    Assessment & Plan:   Geffrey was seen today for cough and hypertension.  Diagnoses and all orders for this visit:  Hypertension, unspecified type- His EKG is negative for LVH, his labs are negative for any secondary causes or end organ damage, will start treating the hypertension with an ARB and thiazide diuretic. -     EKG 12-Lead -     CBC with Differential/Platelet; Future -     Comprehensive metabolic panel; Future -     Thyroid Panel With TSH; Future -     Urinalysis, Routine w reflex microscopic; Future -     Azilsartan-Chlorthalidone (EDARBYCLOR) 40-12.5 MG TABS; Take 1 tablet by mouth daily.  Cough- his chest x-ray is normal, his FeNO score is not significantly elevated and he did not respond to prednisone so I don't think he has asthma, an allergic condition, or an eosinophilic-related cough. I am concerned that this may be  related to GERD. Will offer Hycodan for symptom relief while the GERD is being treated. -     POCT EXHALED NITRIC OXIDE -     DG Chest 2 View; Future -     HYDROcodone-homatropine (HYCODAN) 5-1.5 MG/5ML syrup; Take 5 mLs by mouth every 8 (eight) hours as needed for cough.  Gastroesophageal reflux disease with esophagitis- I am concerned this may be triggering of his cough so I've asked him to start taking a PPI. -     Discontinue: pantoprazole (PROTONIX) 40 MG tablet; Take 1 tablet (40 mg total) by mouth daily. -     pantoprazole (PROTONIX) 40 MG tablet; Take 1 tablet (40 mg total) by mouth daily.   I have discontinued Mr. Bolger predniSONE and albuterol. I am also having him start on Azilsartan-Chlorthalidone and HYDROcodone-homatropine. Additionally, I am having him maintain his OTEZLA and pantoprazole.  Meds ordered this encounter  Medications  . OTEZLA  30 MG TABS    Refill:  0  . DISCONTD: pantoprazole (PROTONIX) 40 MG tablet    Sig: Take 1 tablet (40 mg total) by mouth daily.    Dispense:  90 tablet    Refill:  1  . Azilsartan-Chlorthalidone (EDARBYCLOR) 40-12.5 MG TABS    Sig: Take 1 tablet by mouth daily.    Dispense:  28 tablet    Refill:  0  . pantoprazole (PROTONIX) 40 MG tablet    Sig: Take 1 tablet (40 mg total) by mouth daily.    Dispense:  90 tablet    Refill:  1  . HYDROcodone-homatropine (HYCODAN) 5-1.5 MG/5ML syrup    Sig: Take 5 mLs by mouth every 8 (eight) hours as needed for cough.    Dispense:  120 mL    Refill:  0     Follow-up: Return in about 3 weeks (around 06/10/2017).  [name], MD

## 2017-05-20 NOTE — Patient Instructions (Signed)
Cough, Adult Coughing is a reflex that clears your throat and your airways. Coughing helps to heal and protect your lungs. It is normal to cough occasionally, but a cough that happens with other symptoms or lasts a long time may be a sign of a condition that needs treatment. A cough may last only 2-3 weeks (acute), or it may last longer than 8 weeks (chronic). What are the causes? Coughing is commonly caused by:  Breathing in substances that irritate your lungs.  A viral or bacterial respiratory infection.  Allergies.  Asthma.  Postnasal drip.  Smoking.  Acid backing up from the stomach into the esophagus (gastroesophageal reflux).  Certain medicines.  Chronic lung problems, including COPD (or rarely, lung cancer).  Other medical conditions such as heart failure.  Follow these instructions at home: Pay attention to any changes in your symptoms. Take these actions to help with your discomfort:  Take medicines only as told by your health care provider. ? If you were prescribed an antibiotic medicine, take it as told by your health care provider. Do not stop taking the antibiotic even if you start to feel better. ? Talk with your health care provider before you take a cough suppressant medicine.  Drink enough fluid to keep your urine clear or pale yellow.  If the air is dry, use a cold steam vaporizer or humidifier in your bedroom or your home to help loosen secretions.  Avoid anything that causes you to cough at work or at home.  If your cough is worse at night, try sleeping in a semi-upright position.  Avoid cigarette smoke. If you smoke, quit smoking. If you need help quitting, ask your health care provider.  Avoid caffeine.  Avoid alcohol.  Rest as needed.  Contact a health care provider if:  You have new symptoms.  You cough up pus.  Your cough does not get better after 2-3 weeks, or your cough gets worse.  You cannot control your cough with suppressant  medicines and you are losing sleep.  You develop pain that is getting worse or pain that is not controlled with pain medicines.  You have a fever.  You have unexplained weight loss.  You have night sweats. Get help right away if:  You cough up blood.  You have difficulty breathing.  Your heartbeat is very fast. This information is not intended to replace advice given to you by your health care provider. Make sure you discuss any questions you have with your health care provider. Document Released: 05/18/2011 Document Revised: 04/26/2016 Document Reviewed: 01/26/2015 Elsevier Interactive Patient Education  2017 [employer]  

## 2017-05-21 ENCOUNTER — Encounter: Payer: Self-pay | Admitting: Internal Medicine

## 2017-06-17 ENCOUNTER — Ambulatory Visit (INDEPENDENT_AMBULATORY_CARE_PROVIDER_SITE_OTHER): Payer: BLUE CROSS/BLUE SHIELD | Admitting: Internal Medicine

## 2017-06-17 ENCOUNTER — Telehealth: Payer: Self-pay

## 2017-06-17 ENCOUNTER — Encounter: Payer: Self-pay | Admitting: Internal Medicine

## 2017-06-17 VITALS — BP 144/78 | HR 60 | Temp 97.7°F | Resp 16 | Ht 70.0 in | Wt 171.0 lb

## 2017-06-17 DIAGNOSIS — I10 Essential (primary) hypertension: Secondary | ICD-10-CM

## 2017-06-17 DIAGNOSIS — R739 Hyperglycemia, unspecified: Secondary | ICD-10-CM | POA: Diagnosis not present

## 2017-06-17 NOTE — Telephone Encounter (Signed)
Order [id number]

## 2017-06-17 NOTE — Progress Notes (Signed)
Subjective:  Patient ID: Chase Oneal, male    DOB: 09/13/1966  Age: 51 y.o. MRN: [id number]  CC: Hypertension   HPI Chase BankerSteven W Oneal presents for a BP check - he decided not to start Edarbyclor. He has lowered his intake of sodium and caffeine and has been more active. He denies DOE, HA, CP, SOB, edema, fatigue.  Outpatient Medications Prior to Visit  Medication Sig Dispense Refill  . OTEZLA 30 MG TABS   0  . Azilsartan-Chlorthalidone (EDARBYCLOR) 40-12.5 MG TABS Take 1 tablet by mouth daily. (Patient not taking: Reported on 06/17/2017) 28 tablet 0  . HYDROcodone-homatropine (HYCODAN) 5-1.5 MG/5ML syrup Take 5 mLs by mouth every 8 (eight) hours as needed for cough. 120 mL 0  . pantoprazole (PROTONIX) 40 MG tablet Take 1 tablet (40 mg total) by mouth daily. 90 tablet 1   No facility-administered medications prior to visit.     ROS Review of Systems  Constitutional: Negative.  Negative for diaphoresis, fatigue and unexpected weight change.  HENT: Negative.   Eyes: Negative.   Respiratory: Negative.  Negative for cough, chest tightness, shortness of breath and wheezing.   Cardiovascular: Negative for chest pain, palpitations and leg swelling.  Gastrointestinal: Negative for abdominal pain, blood in stool, constipation, diarrhea, nausea and vomiting.  Endocrine: Negative.   Genitourinary: Negative.  Negative for difficulty urinating, frequency, hematuria and urgency.  Musculoskeletal: Negative for back pain and myalgias.  Skin: Negative.  Negative for color change and rash.  Allergic/Immunologic: Negative.   Neurological: Negative.  Negative for dizziness, weakness and headaches.  Hematological: Negative for adenopathy. Does not bruise/bleed easily.  Psychiatric/Behavioral: Negative.     Objective:  BP (!) 144/78 (BP Location: Left Arm, Patient Position: Sitting, Cuff Size: Normal)   Pulse 60   Temp 97.7 F (36.5 C) (Oral)   Resp 16   Ht 5\' 10"  (1.778 m)   Wt 171 lb (77.6  kg)   SpO2 99%   BMI 24.54 kg/m   BP Readings from Last 3 Encounters:  06/17/17 (!) 144/78  05/20/17 (!) 150/100  04/17/17 (!) 158/98    Wt Readings from Last 3 Encounters:  06/17/17 171 lb (77.6 kg)  05/20/17 174 lb 12 oz (79.3 kg)  04/17/17 179 lb (81.2 kg)    Physical Exam  Constitutional: He is oriented to person, place, and time. No distress.  HENT:  Mouth/Throat: Oropharynx is clear and moist. No oropharyngeal exudate.  Eyes: Conjunctivae are normal. Right eye exhibits no discharge. Left eye exhibits no discharge. No scleral icterus.  Neck: Normal range of motion. No JVD present. No thyromegaly present.  Cardiovascular: Normal rate, regular rhythm and intact distal pulses.  Exam reveals no gallop.   No murmur heard. Pulmonary/Chest: Effort normal and breath sounds normal. No respiratory distress. He has no wheezes. He has no rales. He exhibits no tenderness.  Abdominal: Soft. Bowel sounds are normal. He exhibits no distension and no mass. There is no tenderness. There is no rebound and no guarding.  Musculoskeletal: Normal range of motion. He exhibits no edema, tenderness or deformity.  Lymphadenopathy:    He has no cervical adenopathy.  Neurological: He is alert and oriented to person, place, and time.  Skin: Skin is warm and dry. No rash noted. He is not diaphoretic. No erythema. No pallor.  Vitals reviewed.   Lab Results  Component Value Date   WBC 10.5 05/20/2017   HGB 16.4 05/20/2017   HCT 47.4 05/20/2017   PLT 261.0 Platelet  estimate appears normal. 05/20/2017   GLUCOSE 131 (H) 05/20/2017   CHOL 217 (H) 01/11/2016   TRIG 363.0 (H) 01/11/2016   HDL 42.60 01/11/2016   LDLDIRECT 122.0 01/11/2016   ALT 27 05/20/2017   AST 18 05/20/2017   NA 137 05/20/2017   K 4.7 05/20/2017   CL 101 05/20/2017   CREATININE 1.08 05/20/2017   BUN 18 05/20/2017   CO2 29 05/20/2017   TSH 0.57 05/20/2017   PSA 0.74 01/11/2016    Dg Chest 2 View  Result Date:  05/20/2017 CLINICAL DATA:  Dry cough.  Newly diagnosed with hypertension. EXAM: CHEST  2 VIEW COMPARISON:  Two-view chest x-ray 12/14/2015 FINDINGS: The heart size and mediastinal contours are within normal limits. Both lungs are clear. The visualized skeletal structures are unremarkable. IMPRESSION: Negative two view radiograph. Electronically Signed   By: Marin Roberts M.D.   On: 05/20/2017 14:55    Assessment & Plan:   Jamas was seen today for hypertension.  Diagnoses and all orders for this visit:  Essential hypertension- His blood pressure is not quite adequately well controlled but he is also not willing to take an antihypertensive. He will continue to work on his lifestyle modifications and we'll recheck his blood pressure in about 4 months. -     Basic metabolic panel; Future  Hyperglycemia- I will recheck his blood sugar today and will also get an A1c to see if he has developed type 2 diabetes mellitus. -     Basic metabolic panel; Future -     Hemoglobin A1c; Future   I have discontinued Chase Oneal's pantoprazole and HYDROcodone-homatropine. I am also having him maintain his OTEZLA and Azilsartan-Chlorthalidone.  No orders of the defined types were placed in this encounter.    Follow-up: No Follow-up on file.  [name], MD

## 2017-06-18 NOTE — Patient Instructions (Signed)

## 2017-07-09 DIAGNOSIS — Z1212 Encounter for screening for malignant neoplasm of rectum: Secondary | ICD-10-CM | POA: Diagnosis not present

## 2017-07-09 DIAGNOSIS — Z1211 Encounter for screening for malignant neoplasm of colon: Secondary | ICD-10-CM | POA: Diagnosis not present

## 2017-07-09 LAB — COLOGUARD: COLOGUARD: NEGATIVE

## 2017-07-18 ENCOUNTER — Encounter: Payer: Self-pay | Admitting: Internal Medicine

## 2017-07-18 NOTE — Progress Notes (Unsigned)
Abstracted and sent to scan  

## 2017-07-25 DIAGNOSIS — M2550 Pain in unspecified joint: Secondary | ICD-10-CM | POA: Diagnosis not present

## 2017-07-25 DIAGNOSIS — L4059 Other psoriatic arthropathy: Secondary | ICD-10-CM | POA: Diagnosis not present

## 2017-09-15 IMAGING — DX DG CHEST 2V
2 series · 2 of 2 positions shown · non-contrast
Comparison: Two-view chest x-ray 12/14/2015

CLINICAL DATA: Dry cough.  Newly diagnosed with hypertension.

EXAM:
CHEST  2 VIEW

[chest pa]
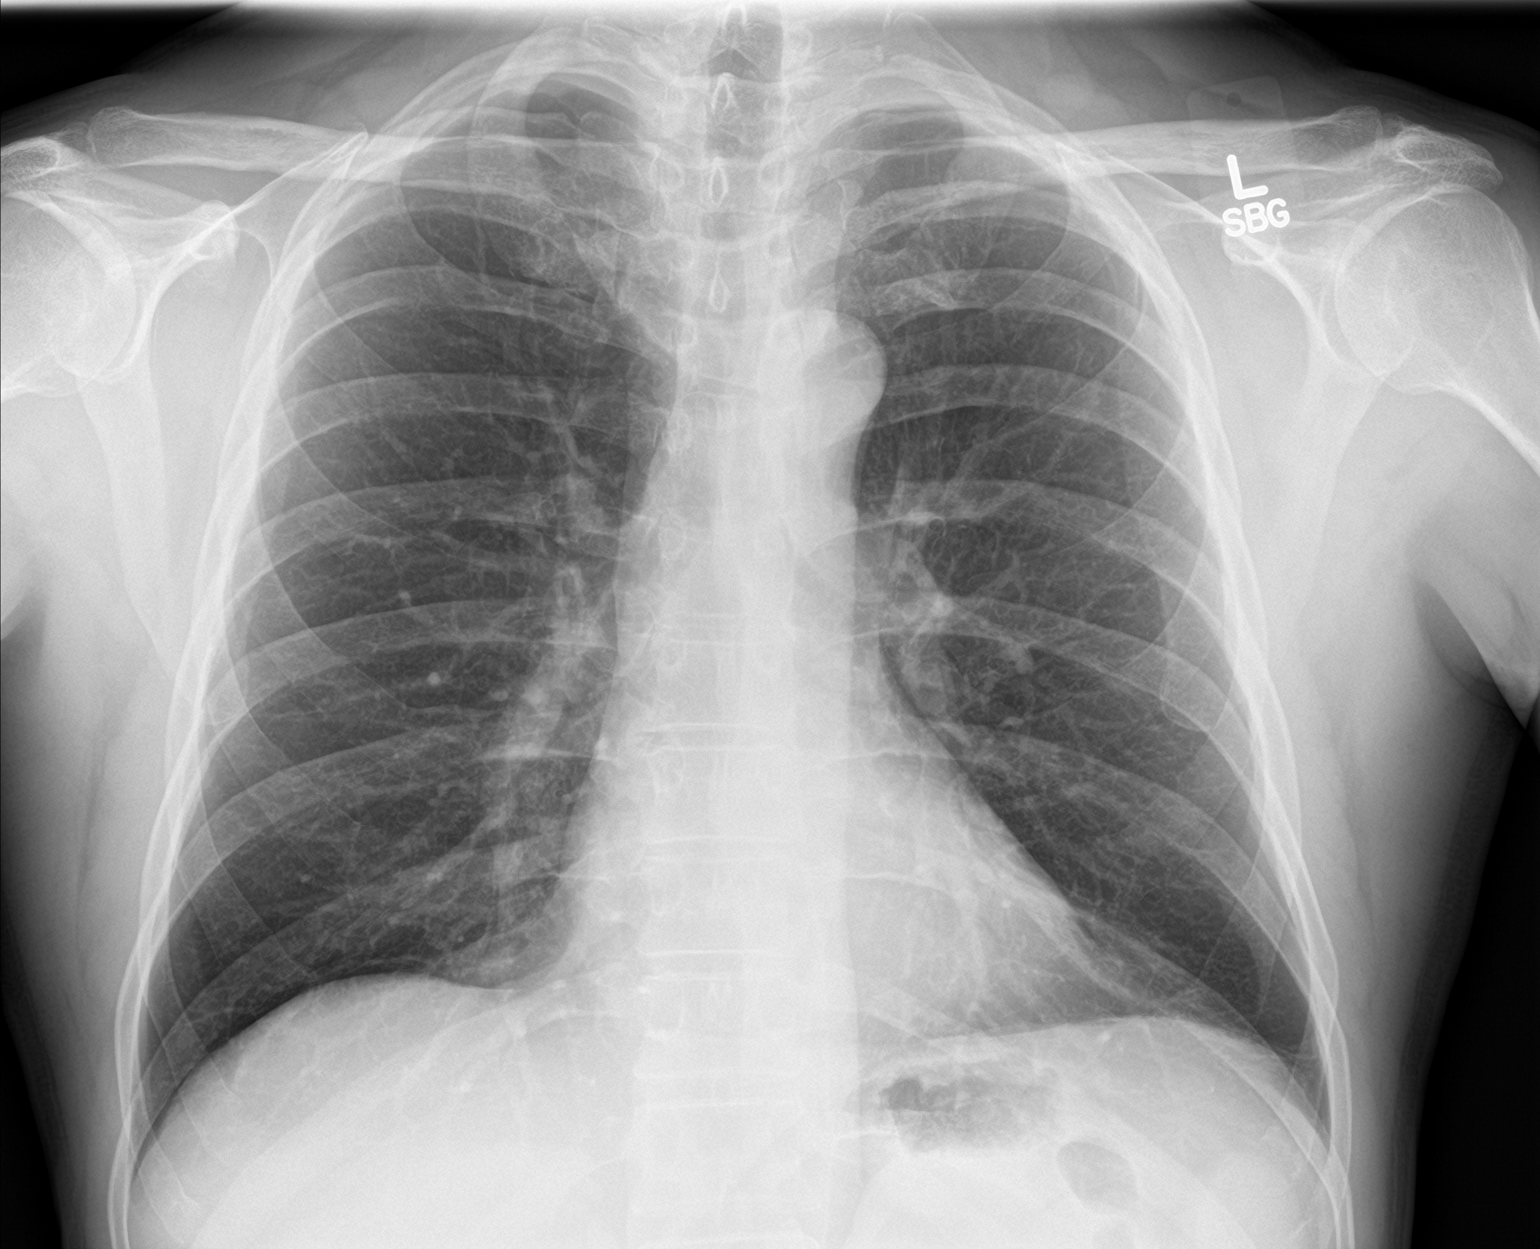

[chest lat]
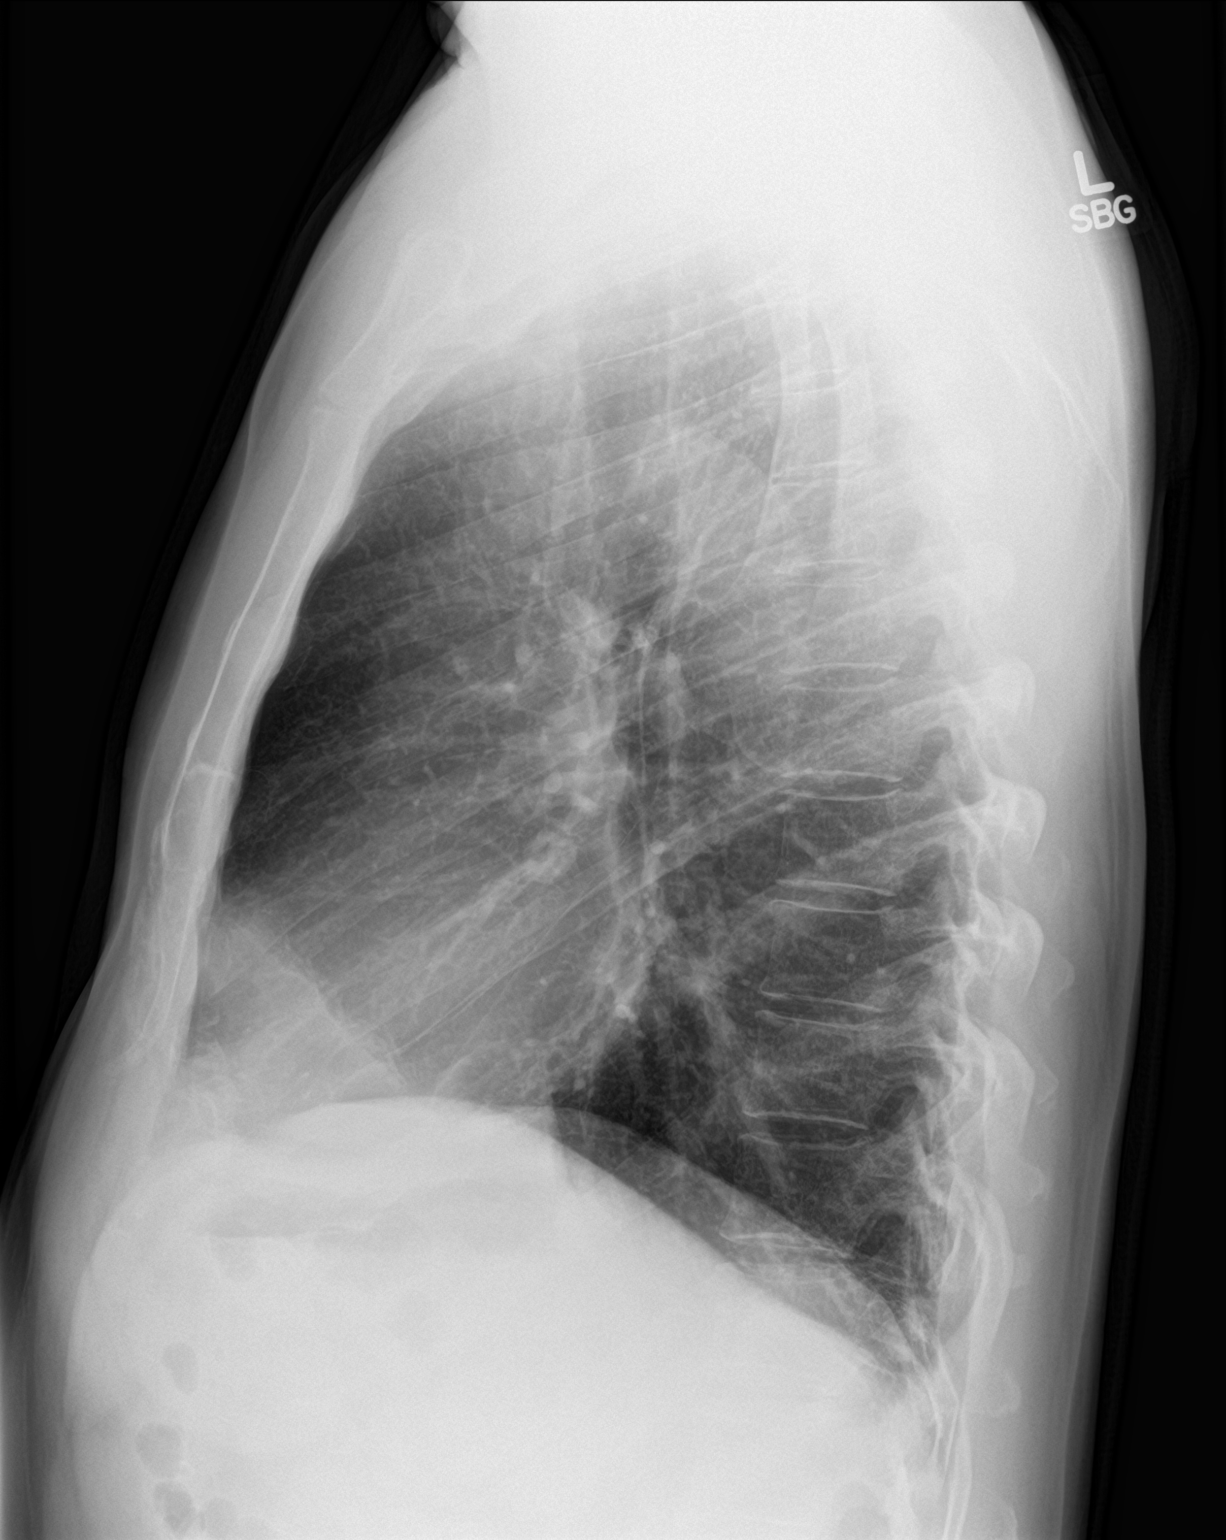

[2 of 2 positions shown; findings below may reference images not displayed]

FINDINGS: The heart size and mediastinal contours are within normal limits.
Both lungs are clear. The visualized skeletal structures are
unremarkable.
IMPRESSION: Negative two view radiograph.

## 2017-09-17 DIAGNOSIS — M2550 Pain in unspecified joint: Secondary | ICD-10-CM | POA: Diagnosis not present

## 2017-09-17 DIAGNOSIS — L4059 Other psoriatic arthropathy: Secondary | ICD-10-CM | POA: Diagnosis not present

## 2017-10-16 DIAGNOSIS — H6091 Unspecified otitis externa, right ear: Secondary | ICD-10-CM | POA: Diagnosis not present

## 2017-10-16 DIAGNOSIS — Z6826 Body mass index (BMI) 26.0-26.9, adult: Secondary | ICD-10-CM | POA: Diagnosis not present

## 2017-12-19 DIAGNOSIS — M2550 Pain in unspecified joint: Secondary | ICD-10-CM | POA: Diagnosis not present

## 2017-12-19 DIAGNOSIS — L4059 Other psoriatic arthropathy: Secondary | ICD-10-CM | POA: Diagnosis not present

## 2018-01-08 NOTE — Telephone Encounter (Signed)
cologuard completed.

## 2018-02-14 DIAGNOSIS — J028 Acute pharyngitis due to other specified organisms: Secondary | ICD-10-CM | POA: Diagnosis not present

## 2018-03-19 DIAGNOSIS — L4059 Other psoriatic arthropathy: Secondary | ICD-10-CM | POA: Diagnosis not present

## 2018-03-19 DIAGNOSIS — L659 Nonscarring hair loss, unspecified: Secondary | ICD-10-CM | POA: Diagnosis not present

## 2018-03-19 DIAGNOSIS — M2550 Pain in unspecified joint: Secondary | ICD-10-CM | POA: Diagnosis not present

## 2018-04-17 DIAGNOSIS — H5203 Hypermetropia, bilateral: Secondary | ICD-10-CM | POA: Diagnosis not present

## 2018-04-17 DIAGNOSIS — H524 Presbyopia: Secondary | ICD-10-CM | POA: Diagnosis not present

## 2018-06-18 ENCOUNTER — Ambulatory Visit (INDEPENDENT_AMBULATORY_CARE_PROVIDER_SITE_OTHER): Payer: BLUE CROSS/BLUE SHIELD | Admitting: Family

## 2018-06-18 VITALS — BP 130/88 | HR 68 | Temp 97.6°F | Ht 70.0 in | Wt 179.0 lb

## 2018-06-18 DIAGNOSIS — H6690 Otitis media, unspecified, unspecified ear: Secondary | ICD-10-CM | POA: Diagnosis not present

## 2018-06-18 DIAGNOSIS — L409 Psoriasis, unspecified: Secondary | ICD-10-CM | POA: Insufficient documentation

## 2018-06-18 DIAGNOSIS — L4050 Arthropathic psoriasis, unspecified: Secondary | ICD-10-CM | POA: Diagnosis not present

## 2018-06-18 MED ORDER — AMOXICILLIN-POT CLAVULANATE 875-125 MG PO TABS: 1.0000 | ORAL_TABLET | Freq: Two times a day (BID) | ORAL | 0 refills | Status: DC

## 2018-06-18 MED ORDER — NEOMYCIN-POLYMYXIN-HC 3.5-10000-1 OT SOLN: 3.0000 [drp] | Freq: Four times a day (QID) | OTIC | 0 refills | Status: DC

## 2018-06-18 NOTE — Progress Notes (Signed)
Chase Oneal is a 52 y.o. male with the following history as recorded in [employer]:  Patient Active Problem List   Diagnosis Date Noted  . Hyperglycemia 06/17/2017  . Hypertension 05/20/2017  . Hypersomnia 02/02/2016  . Chronic bronchitis (HCC) 02/02/2016  . OSA (obstructive sleep apnea) 01/11/2016  . Routine general medical examination at a health care facility 12/14/2015  . Colon cancer screening 12/14/2015  . Chest pain, precordial 12/14/2015  . Bilateral inguinal hernia (BIH), L>>R 05/06/2012  . ERECTILE DYSFUNCTION, ORGANIC 11/22/2009  . GERD 07/04/2009    Current Outpatient Medications  Medication Sig Dispense Refill  . Ixekizumab 80 MG/ML SOSY Inject into the skin.    Marland Kitchen. amoxicillin-clavulanate (AUGMENTIN) 875-125 MG tablet Take 1 tablet by mouth 2 (two) times daily. 20 tablet 0  . neomycin-polymyxin-hydrocortisone (CORTISPORIN) OTIC solution Place 3 drops into both ears 4 (four) times daily. 10 mL 0   No current facility-administered medications for this visit.     Allergies: Patient has no known allergies.  Past Medical History:  Diagnosis Date  . GERD (gastroesophageal reflux disease)     Past Surgical History:  Procedure Laterality Date  . NASAL SEPTUM SURGERY  20 yrs ago    Family History  Problem Relation Age of Onset  . Arthritis Mother   . Sleep apnea Mother     Social History   Tobacco Use  . Smoking status: Never Smoker  . Smokeless tobacco: Never Used  Substance Use Topics  . Alcohol use: Yes    Alcohol/week: 3.6 oz    Types: 6 Glasses of wine per week    Comment: weekly    Subjective:  Patient presents with concerns for 2 month history of "ears feeling clogged up." No prior history of needing to have ear wax removed; feels like symptoms began after starting Toltz for psoriatic arthritis; does have drainage from both ears- "feels clogged/ under water."   Objective:  Vitals:   06/18/18 1027  BP: 130/88  Pulse: 68  Temp: 97.6 F (36.4 C)   TempSrc: Oral  SpO2: 97%  Weight: 179 lb (81.2 kg)  Height: 5\' 10"  (1.778 m)    General: Well developed, well nourished, in no acute distress  Skin : Warm and dry.  Head: Normocephalic and atraumatic  Eyes: Sclera and conjunctiva clear; pupils round and reactive to light; extraocular movements intact  Ears: External normal; canals erythematous/ scaling; drainage noted in right ear/ L TM erythematous Oropharynx: Pink, supple. No suspicious lesions  Neck: Supple without thyromegaly, adenopathy  Lungs: Respirations unlabored; clear to auscultation bilaterally without wheeze, rales, rhonchi  Neurologic: Alert and oriented; speech intact; face symmetrical; moves all extremities well; CNII-XII intact without focal deficit   Assessment:  1. Acute otitis media, unspecified otitis media type     Plan:  Rx for Augmentin 875 mg bid x 10 days and Cortisporin to help with outer ear swelling/ redness; encouraged to talk to his rheumatologist about symptoms- ? Side effect of medication;  Due for yearly CPE with Dr. Yetta BarreJones- defers scheduling at this time.  No follow-ups on file.  No orders of the defined types were placed in this encounter.   Requested Prescriptions   Signed Prescriptions Disp Refills  . amoxicillin-clavulanate (AUGMENTIN) 875-125 MG tablet 20 tablet 0    Sig: Take 1 tablet by mouth 2 (two) times daily.  Marland Kitchen. neomycin-polymyxin-hydrocortisone (CORTISPORIN) OTIC solution 10 mL 0    Sig: Place 3 drops into both ears 4 (four) times daily.

## 2018-07-01 ENCOUNTER — Encounter: Payer: Self-pay | Admitting: Family

## 2018-07-01 ENCOUNTER — Ambulatory Visit: Payer: Self-pay | Admitting: *Deleted

## 2018-07-01 ENCOUNTER — Ambulatory Visit (INDEPENDENT_AMBULATORY_CARE_PROVIDER_SITE_OTHER): Payer: BLUE CROSS/BLUE SHIELD | Admitting: Family

## 2018-07-01 VITALS — BP 128/88 | HR 63 | Temp 97.7°F | Ht 70.0 in | Wt 177.1 lb

## 2018-07-01 DIAGNOSIS — L299 Pruritus, unspecified: Secondary | ICD-10-CM | POA: Diagnosis not present

## 2018-07-01 MED ORDER — RANITIDINE HCL 150 MG PO TABS: 150.0000 mg | ORAL_TABLET | Freq: Two times a day (BID) | ORAL | 0 refills | Status: DC

## 2018-07-01 MED ORDER — CETIRIZINE HCL 10 MG PO TABS: 10.0000 mg | ORAL_TABLET | Freq: Every day | ORAL | 2 refills | Status: DC

## 2018-07-01 MED ORDER — METHYLPREDNISOLONE ACETATE 80 MG/ML IJ SUSP
80.0000 mg | Freq: Once | INTRAMUSCULAR | Status: AC
  Administered 2018-07-01: 80 mg via INTRAMUSCULAR

## 2018-07-01 NOTE — Progress Notes (Signed)
Chase Oneal is a 52 y.o. male with the following history as recorded in EpicCare:  Patient Active Problem List   Diagnosis Date Noted  . Psoriatic arthritis (HCC) 06/18/2018  . Psoriasis 06/18/2018  . Hyperglycemia 06/17/2017  . Hypertension 05/20/2017  . Hypersomnia 02/02/2016  . Chronic bronchitis (HCC) 02/02/2016  . OSA (obstructive sleep apnea) 01/11/2016  . Routine general medical examination at a health care facility 12/14/2015  . Colon cancer screening 12/14/2015  . Chest pain, precordial 12/14/2015  . Bilateral inguinal hernia (BIH), L>>R 05/06/2012  . ERECTILE DYSFUNCTION, ORGANIC 11/22/2009  . GERD 07/04/2009    Current Outpatient Medications  Medication Sig Dispense Refill  . Ixekizumab 80 MG/ML SOSY Inject into the skin.    Marland Kitchen neomycin-polymyxin-hydrocortisone (CORTISPORIN) OTIC solution Place 3 drops into both ears 4 (four) times daily. 10 mL 0  . cetirizine (ZYRTEC) 10 MG tablet Take 1 tablet (10 mg total) by mouth daily. 30 tablet 2  . ranitidine (ZANTAC) 150 MG tablet Take 1 tablet (150 mg total) by mouth 2 (two) times daily. 60 tablet 0   No current facility-administered medications for this visit.     Allergies: Patient has no known allergies.  Past Medical History:  Diagnosis Date  . GERD (gastroesophageal reflux disease)     Past Surgical History:  Procedure Laterality Date  . NASAL SEPTUM SURGERY  20 yrs ago    Family History  Problem Relation Age of Onset  . Arthritis Mother   . Sleep apnea Mother     Social History   Tobacco Use  . Smoking status: Never Smoker  . Smokeless tobacco: Never Used  Substance Use Topics  . Alcohol use: Yes    Alcohol/week: 3.6 oz    Types: 6 Glasses of wine per week    Comment: weekly    Subjective:  Patient was seen on 06/18/2018- treated with Augmentin for otitis externa- was given 10 days course but admits that stopped antibiotics on Wednesday of last week; started itching on Saturday; majority of symptoms   located on bottom of feet and palms/ under left armpit; + itching; denies any new soaps, foods, detergents or medications. Denies any shortness of breath/ difficulty swallowing- felt like chest was "tight" last night but this has since resolved; was outside at music festival this past weekend- increased sun exposure.    Objective:  Vitals:   07/01/18 1011  BP: 128/88  Pulse: 63  Temp: 97.7 F (36.5 C)  TempSrc: Oral  SpO2: 96%  Weight: 177 lb 1.3 oz (80.3 kg)  Height: 5\' 10"  (1.778 m)    General: Well developed, well nourished, in no acute distress  Skin : Warm and dry. Erythema on bottom of feet; no rash on trunk noted Head: Normocephalic and atraumatic  Eyes: Sclera and conjunctiva clear; pupils round and reactive to light; extraocular movements intact  Ears: External normal; canals clear; tympanic membranes normal  Oropharynx: Pink, supple. No suspicious lesions  Neck: Supple without thyromegaly, adenopathy  Lungs: Respirations unlabored; clear to auscultation bilaterally without wheeze, rales, rhonchi  CVS exam: normal rate and regular rhythm.  Neurologic: Alert and oriented; speech intact; face symmetrical; moves all extremities well; CNII-XII intact without focal deficit   Assessment:  1. Itching     Plan:  Low suspicion for Augmentin as he had been off the medication for 4 days prior to onset of symptoms; ? Reaction to sun or something he may have been exposed to at recent music festival; will treat with  Depo-Medrol IM 80, Zantac and Zyrtec; if symptoms persist, will need to consider allergist/ may also want to talk to his rheumatologist to make sure symptoms not [facility]; follow-up as needed.   No follow-ups on file.  No orders of the defined types were placed in this encounter.   Requested Prescriptions   Signed Prescriptions Disp Refills  . cetirizine (ZYRTEC) 10 MG tablet 30 tablet 2    Sig: Take 1 tablet (10 mg total) by mouth daily.  . ranitidine (ZANTAC)  150 MG tablet 60 tablet 0    Sig: Take 1 tablet (150 mg total) by mouth 2 (two) times daily.

## 2018-07-01 NOTE — Telephone Encounter (Signed)
Pt having itching on the soles of his feet and palms of his hands. Denies shortness of breath but feels like something is in his throat, which makes him cough. This started on Saturday night. He also developed a rash on the upper back of his arms that when he scratches turns bright red.  Has taken Benadryl that has helped some but not long and used hydrocortisone cream to the areas.  He just finished taking an antibiotic of amoxicillin on last Friday.  Has not changed laundry detergent or used any new products or socks.  Appointment scheduled for today per protocol. Advised to go to the emergency department if he starts having worsening symptoms, pt voiced understanding. [facility]    Reason for Disposition . [1] MODERATE-SEVERE widespread itching (i.e., interferes with sleep, normal activities or school) AND [2] not improved after 24 hours of itching Care Advice  Answer Assessment - Initial Assessment Questions 1. DESCRIPTION: "Describe the itching you are having."     n/a 2. SEVERITY: "How bad is it?"    - MILD - doesn't interfere with normal activities   - MODERATE - SEVERE: interferes with work, school, sleep, or other activities     Bad when places are open to air 3. SCRATCHING: "Are there any scratch marks? Bleeding?" yes 4. ONSET: "When did this begin?"      Saturday 5. CAUSE: "What do you think is causing the itching?" (ask about swimming pools, pollen, animals, soaps, etc.)    antibiotic 6. OTHER SYMPTOMS: "Do you have any other symptoms?"      Feels like something is in throat and have to cough  Protocols used: ITCHING [facility]- [id number]-A-AH

## 2018-07-29 DIAGNOSIS — L659 Nonscarring hair loss, unspecified: Secondary | ICD-10-CM | POA: Diagnosis not present

## 2018-07-29 DIAGNOSIS — M2550 Pain in unspecified joint: Secondary | ICD-10-CM | POA: Diagnosis not present

## 2018-07-29 DIAGNOSIS — L4059 Other psoriatic arthropathy: Secondary | ICD-10-CM | POA: Diagnosis not present

## 2018-08-19 ENCOUNTER — Ambulatory Visit (INDEPENDENT_AMBULATORY_CARE_PROVIDER_SITE_OTHER)
Admission: RE | Admit: 2018-08-19 | Discharge: 2018-08-19 | Disposition: A | Discharge status: Home / Self Care | Payer: BLUE CROSS/BLUE SHIELD | Source: Ambulatory Visit | Attending: Internal Medicine | Admitting: Internal Medicine

## 2018-08-19 ENCOUNTER — Encounter: Payer: Self-pay | Admitting: Internal Medicine

## 2018-08-19 ENCOUNTER — Ambulatory Visit (INDEPENDENT_AMBULATORY_CARE_PROVIDER_SITE_OTHER): Payer: BLUE CROSS/BLUE SHIELD | Admitting: Internal Medicine

## 2018-08-19 ENCOUNTER — Other Ambulatory Visit (INDEPENDENT_AMBULATORY_CARE_PROVIDER_SITE_OTHER): Payer: BLUE CROSS/BLUE SHIELD

## 2018-08-19 VITALS — BP 140/90 | HR 74 | Temp 97.8°F | Resp 16 | Ht 70.0 in | Wt 177.0 lb

## 2018-08-19 DIAGNOSIS — I10 Essential (primary) hypertension: Secondary | ICD-10-CM | POA: Diagnosis not present

## 2018-08-19 DIAGNOSIS — R059 Cough, unspecified: Secondary | ICD-10-CM

## 2018-08-19 DIAGNOSIS — R739 Hyperglycemia, unspecified: Secondary | ICD-10-CM

## 2018-08-19 DIAGNOSIS — K2100 Gastro-esophageal reflux disease with esophagitis, without bleeding: Secondary | ICD-10-CM

## 2018-08-19 DIAGNOSIS — R1319 Other dysphagia: Secondary | ICD-10-CM | POA: Insufficient documentation

## 2018-08-19 DIAGNOSIS — R05 Cough: Secondary | ICD-10-CM | POA: Diagnosis not present

## 2018-08-19 DIAGNOSIS — L299 Pruritus, unspecified: Secondary | ICD-10-CM

## 2018-08-19 DIAGNOSIS — L503 Dermatographic urticaria: Secondary | ICD-10-CM

## 2018-08-19 DIAGNOSIS — R1310 Dysphagia, unspecified: Secondary | ICD-10-CM

## 2018-08-19 LAB — CBC WITH DIFFERENTIAL/PLATELET
Basophils Absolute: 0 10*3/uL (ref 0.0–0.1)
Basophils Relative: 0.5 % (ref 0.0–3.0)
EOS ABS: 0.1 10*3/uL (ref 0.0–0.7)
EOS PCT: 1.9 % (ref 0.0–5.0)
HCT: 44.8 % (ref 39.0–52.0)
HEMOGLOBIN: 16.1 g/dL (ref 13.0–17.0)
Lymphocytes Relative: 18.7 % (ref 12.0–46.0)
Lymphs Abs: 1.3 10*3/uL (ref 0.7–4.0)
MCHC: 36 g/dL (ref 30.0–36.0)
MCV: 95.5 fl (ref 78.0–100.0)
MONO ABS: 0.7 10*3/uL (ref 0.1–1.0)
Monocytes Relative: 10.5 % (ref 3.0–12.0)
Neutro Abs: 4.7 10*3/uL (ref 1.4–7.7)
Neutrophils Relative %: 68.4 % (ref 43.0–77.0)
Platelets: 174 10*3/uL (ref 150.0–400.0)
RBC: 4.68 Mil/uL (ref 4.22–5.81)
RDW: 12.4 % (ref 11.5–15.5)
WBC: 6.9 10*3/uL (ref 4.0–10.5)

## 2018-08-19 LAB — SEDIMENTATION RATE: Sed Rate: 1 mm/hr (ref 0–20)

## 2018-08-19 LAB — COMPREHENSIVE METABOLIC PANEL
ALBUMIN: 4.6 g/dL (ref 3.5–5.2)
ALK PHOS: 62 U/L (ref 39–117)
ALT: 23 U/L (ref 0–53)
AST: 19 U/L (ref 0–37)
BUN: 20 mg/dL (ref 6–23)
CO2: 27 mEq/L (ref 19–32)
Calcium: 9.5 mg/dL (ref 8.4–10.5)
Chloride: 103 mEq/L (ref 96–112)
Creatinine, Ser: 1.02 mg/dL (ref 0.40–1.50)
GFR: 80.86 mL/min (ref >60.00)
Glucose, Bld: 83 mg/dL (ref 70–99)
POTASSIUM: 4.5 meq/L (ref 3.5–5.1)
SODIUM: 142 meq/L (ref 135–145)
TOTAL PROTEIN: 7.2 g/dL (ref 6.0–8.3)
Total Bilirubin: 0.8 mg/dL (ref 0.2–1.2)

## 2018-08-19 LAB — HEMOGLOBIN A1C: HEMOGLOBIN A1C: 5.4 % (ref 4.6–6.5)

## 2018-08-19 LAB — VITAMIN D 25 HYDROXY (VIT D DEFICIENCY, FRACTURES): VITD: 34.66 ng/mL (ref 30.00–100.00)

## 2018-08-19 LAB — TSH: TSH: 2.34 u[IU]/mL (ref 0.35–4.50)

## 2018-08-19 MED ORDER — DOXEPIN HCL 10 MG PO CAPS: 10.0000 mg | ORAL_CAPSULE | Freq: Every day | ORAL | 1 refills | Status: DC

## 2018-08-19 NOTE — Patient Instructions (Signed)
Pruritus  Pruritus is an itching feeling. There are many different conditions and factors that can make your skin itchy. Dry skin is one of the most common causes of itching. Most cases of itching do not require medical attention. Itchy skin can turn into a rash.  Follow these instructions at home:  Watch your pruritus for any changes. Take these steps to help with your condition:  Skin Care  · Moisturize your skin as needed. A moisturizer that contains petroleum jelly is best for keeping moisture in your skin.  · Take or apply medicines only as directed by your health care provider. This may include:  ? Corticosteroid cream.  ? Anti-itch lotions.  ? Oral anti-histamines.  · Apply cool compresses to the affected areas.  · Try taking a bath with:  ? Epsom salts. Follow the instructions on the packaging. You can get these at your local pharmacy or grocery store.  ? Baking soda. Pour a small amount into the bath as directed by your health care provider.  ? Colloidal oatmeal. Follow the instructions on the packaging. You can get this at your local pharmacy or grocery store.  · Try applying baking soda paste to your skin. Stir water into baking soda until it reaches a paste-like consistency.  · Do not scratch your skin.  · Avoid hot showers or baths, which can make itching worse. A cold shower may help with itching as long as you use a moisturizer after.  · Avoid scented soaps, detergents, and perfumes. Use gentle soaps, detergents, perfumes, and other cosmetic products.  General instructions  · Avoid wearing tight clothes.  · Keep a journal to help track what causes your itch. Write down:  ? What you eat.  ? What cosmetic products you use.  ? What you drink.  ? What you wear. This includes jewelry.  · Use a humidifier. This keeps the air moist, which helps to prevent dry skin.  Contact a health care provider if:  · The itching does not go away after several days.  · You sweat at night.  · You have weight loss.  · You  are unusually thirsty.  · You urinate more than normal.  · You are more tired than normal.  · You have abdominal pain.  · Your skin tingles.  · You feel weak.  · Your skin or the whites of your eyes look yellow (jaundice).  · Your skin feels numb.  This information is not intended to replace advice given to you by your health care provider. Make sure you discuss any questions you have with your health care provider.  Document Released: 08/01/2011 Document Revised: 04/26/2016 Document Reviewed: 11/15/2014  Elsevier Interactive Patient Education © 2018 [employer]

## 2018-08-19 NOTE — Progress Notes (Signed)
Subjective:  Patient ID: Chase BankerSteven W Spees, male    DOB: [birth year]  Age: 52 y.o. MRN: [id number]  CC: Cough and Rash   HPI Chase Oneal presents for f/up - He complains of a several month history of severe and uncontrollable itching.  He was seen by someone else a few months ago and given a course of steroids which he says briefly helped.  He has also tried to control this with Zantac and Zyrtec neither of which have helped.  He says he does not notice the rash until he scratches and then he notices whelps.  He has a history of psoriasis which is currently well controlled with biological agent.  He also complains of trouble swallowing.  He has a history of heartburn which is well controlled with Zantac and over-the-counter PPI.  He complains of an achy sensation in his esophagus when he swallows.  He denies loss of appetite, weight loss, abdominal pain, melena, or diarrhea.  He tells me 1 of his parents had to have their esophagus stretched.  Also complains of chronic, nonproductive cough.  He tells me his blood pressure has been well controlled.  He is a Armed forces operational officerlong-distance hiker and does not experience any CP, DOE, diaphoresis, edema, or fatigue.  Outpatient Medications Prior to Visit  Medication Sig Dispense Refill  . Ixekizumab 80 MG/ML SOSY Inject into the skin.    . ranitidine (ZANTAC) 150 MG tablet Take 1 tablet (150 mg total) by mouth 2 (two) times daily. (Patient not taking: Reported on 08/19/2018) 60 tablet 0  . cetirizine (ZYRTEC) 10 MG tablet Take 1 tablet (10 mg total) by mouth daily. (Patient not taking: Reported on 08/19/2018) 30 tablet 2  . neomycin-polymyxin-hydrocortisone (CORTISPORIN) OTIC solution Place 3 drops into both ears 4 (four) times daily. (Patient not taking: Reported on 08/19/2018) 10 mL 0   No facility-administered medications prior to visit.     ROS Review of Systems  Constitutional: Negative for chills, diaphoresis, fatigue, fever and unexpected weight change.    HENT: Positive for trouble swallowing. Negative for sore throat and voice change.   Eyes: Negative for visual disturbance.  Respiratory: Positive for cough. Negative for chest tightness, shortness of breath and wheezing.   Cardiovascular: Negative for chest pain, palpitations and leg swelling.  Gastrointestinal: Negative for abdominal pain, constipation, diarrhea and nausea.  Genitourinary: Negative.  Negative for difficulty urinating, dysuria, flank pain and frequency.  Musculoskeletal: Negative.  Negative for arthralgias and myalgias.  Skin: Positive for rash. Negative for color change.  Neurological: Negative for dizziness, weakness, light-headedness and headaches.  Hematological: Negative for adenopathy. Does not bruise/bleed easily.  Psychiatric/Behavioral: Negative.     Objective:  BP 140/90 (BP Location: Left Arm, Patient Position: Sitting, Cuff Size: Normal)   Pulse 74   Temp 97.8 F (36.6 C) (Oral)   Resp 16   Ht 5\' 10"  (1.778 m)   Wt 177 lb (80.3 kg)   SpO2 97%   BMI 25.40 kg/m   BP Readings from Last 3 Encounters:  08/19/18 140/90  07/01/18 128/88  06/18/18 130/88    Wt Readings from Last 3 Encounters:  08/19/18 177 lb (80.3 kg)  07/01/18 177 lb 1.3 oz (80.3 kg)  06/18/18 179 lb (81.2 kg)    Physical Exam  Constitutional: He is oriented to person, place, and time. No distress.  HENT:  Mouth/Throat: Oropharynx is clear and moist. No oropharyngeal exudate.  Eyes: Conjunctivae are normal. No scleral icterus.  Neck: Normal range of motion.  Neck supple. No JVD present. No thyromegaly present.  Cardiovascular: Normal rate, regular rhythm and normal heart sounds. Exam reveals no gallop and no friction rub.  No murmur heard. Pulmonary/Chest: Effort normal and breath sounds normal. No respiratory distress. He has no wheezes. He has no rales.  Abdominal: Soft. Bowel sounds are normal. He exhibits no mass. There is no hepatosplenomegaly. There is no tenderness.   Musculoskeletal: Normal range of motion. He exhibits no edema, tenderness or deformity.  Lymphadenopathy:    He has no cervical adenopathy.  Neurological: He is alert and oriented to person, place, and time.  Skin: Skin is warm and dry. Rash noted. No bruising and no petechiae noted. Rash is urticarial. Rash is not macular, not papular, not maculopapular and not vesicular. He is not diaphoretic. No erythema. No pallor.  At the outset of hisvisit today there was no rash.  During the exam today he scratched the volar side of his left forearm and it left 3 urticarial streaks.  Psychiatric: He has a normal mood and affect. His behavior is normal. Judgment and thought content normal.  Vitals reviewed.   Lab Results  Component Value Date   WBC 6.9 08/19/2018   HGB 16.1 08/19/2018   HCT 44.8 08/19/2018   PLT 174.0 08/19/2018   GLUCOSE 83 08/19/2018   CHOL 217 (H) 01/11/2016   TRIG 363.0 (H) 01/11/2016   HDL 42.60 01/11/2016   LDLDIRECT 122.0 01/11/2016   ALT 23 08/19/2018   AST 19 08/19/2018   NA 142 08/19/2018   K 4.5 08/19/2018   CL 103 08/19/2018   CREATININE 1.02 08/19/2018   BUN 20 08/19/2018   CO2 27 08/19/2018   TSH 2.34 08/19/2018   PSA 0.74 01/11/2016   HGBA1C 5.4 08/19/2018    Dg Chest 2 View  Result Date: 05/20/2017 CLINICAL DATA:  Dry cough.  Newly diagnosed with hypertension. EXAM: CHEST  2 VIEW COMPARISON:  Two-view chest x-ray 12/14/2015 FINDINGS: The heart size and mediastinal contours are within normal limits. Both lungs are clear. The visualized skeletal structures are unremarkable. IMPRESSION: Negative two view radiograph. Electronically Signed   By: Marin Roberts M.D.   On: 05/20/2017 14:55    Assessment & Plan:   Romir was seen today for cough and rash.  Diagnoses and all orders for this visit:  Essential hypertension-his blood pressure is not quite adequately well controlled.  He is not willing to take an antihypertensive.  His labs are negative  for secondary causes or endorgan damage.  He will continue to work on his lifestyle modifications. -     TSH; Future -     VITAMIN D 25 Hydroxy (Vit-D Deficiency, Fractures); Future  Hyperglycemia- Improvement noted. -     Comprehensive metabolic panel; Future -     Hemoglobin A1c; Future  Gastroesophageal reflux disease with esophagitis -     CBC with Differential/Platelet; Future -     Ambulatory referral to Gastroenterology  Severe itching- His labs are all normal indicating no organic causes.  I do not see any medications that could be causing this.  I have asked him to try a once daily potent antihistamine at bedtime to treat this. -     CBC with Differential/Platelet; Future -     Comprehensive metabolic panel; Future -     Sedimentation rate; Future -     doxepin (SINEQUAN) 10 MG capsule; Take 1 capsule (10 mg total) by mouth at bedtime.  Dermagraphy- Will treat with an antihistamine  Esophageal dysphagia- I have asked him to see GI to see if he needs to undergo upper endoscopy. -     Ambulatory referral to Gastroenterology  Cough- His chest x-ray is negative for mass or infiltrate.  He does not want to treat the cough. -     DG Chest 2 View; Future   I have discontinued Leibish Mcgregor. Mcmurphy's neomycin-polymyxin-hydrocortisone and cetirizine. I am also having him start on doxepin. Additionally, I am having him maintain his Ixekizumab and ranitidine.  Meds ordered this encounter  Medications  . doxepin (SINEQUAN) 10 MG capsule    Sig: Take 1 capsule (10 mg total) by mouth at bedtime.    Dispense:  90 capsule    Refill:  1     Follow-up: Return in about 4 weeks (around 09/16/2018).  [name], MD

## 2018-08-21 ENCOUNTER — Encounter: Payer: Self-pay | Admitting: Nurse Practitioner

## 2018-08-26 ENCOUNTER — Ambulatory Visit (INDEPENDENT_AMBULATORY_CARE_PROVIDER_SITE_OTHER): Payer: BLUE CROSS/BLUE SHIELD | Admitting: Nurse Practitioner

## 2018-08-26 ENCOUNTER — Encounter: Payer: Self-pay | Admitting: Nurse Practitioner

## 2018-08-26 VITALS — BP 130/80 | HR 64 | Ht 70.0 in | Wt 187.0 lb

## 2018-08-26 DIAGNOSIS — Z8379 Family history of other diseases of the digestive system: Secondary | ICD-10-CM | POA: Diagnosis not present

## 2018-08-26 DIAGNOSIS — R0789 Other chest pain: Secondary | ICD-10-CM

## 2018-08-26 NOTE — Patient Instructions (Signed)
You have been scheduled for a [facility] of the hospital) on 09/15/18 at 930 am. Please arrive 15 minutes prior to your appointment for registration. Make certain not to have anything to eat or drink 3 hours prior to your test. If you need to reschedule for any reason, please contact radiology at [phone] to do so. __________________________________________________________________ A barium swallow is an examination that concentrates on views of the esophagus. This tends to be a double contrast exam (barium and two liquids which, when combined, create a gas to distend the wall of the oesophagus) or single contrast (non-ionic iodine based). The study is usually tailored to your symptoms so a good history is essential. Attention is paid during the study to the form, structure and configuration of the esophagus, looking for functional disorders (such as aspiration, dysphagia, achalasia, motility and reflux) EXAMINATION You may be asked to change into a gown, depending on the type of swallow being performed. A radiologist and radiographer will perform the procedure. The radiologist will advise you of the type of contrast selected for your procedure and direct you during the exam. You will be asked to stand, sit or lie in several different positions and to hold a small amount of fluid in your mouth before being asked to swallow while the imaging is performed .In some instances you may be asked to swallow barium coated marshmallows to assess the motility of a solid food bolus. The exam can be recorded as a digital or video fluoroscopy procedure. POST PROCEDURE It will take 1-2 days for the barium to pass through your system. To facilitate this, it is important, unless otherwise directed, to increase your fluids for the next 24-48hrs and to resume your normal diet.  This test typically takes about 30 minutes to  perform. ______________________________________________________________________ If you are age 54 or older, your body mass index should be between 23-30. Your Body mass index is 26.83 kg/m. If this is out of the aforementioned range listed, please consider follow up with your Primary Care Provider.  If you are age 54 or younger, your body mass index should be between 19-25. Your Body mass index is 26.83 kg/m. If this is out of the aformentioned range listed, please consider follow up with your Primary Care Provider.   Thank you for choosing me and [facility].   Willette ClusterPaula Guenther, NP

## 2018-08-26 NOTE — Progress Notes (Addendum)
GI Provider:  New patient      Chief Complaint: chest discomfort  Referring Provider: [name], MD   ASSESSMENT AND PLAN;   1. 52 yo male with one month history of constant, non-exertional chest discomfort. No SOB. Discomfort worse with deep breath. No odynophagia or dysphagia but patient has an awareness of food passing through the esophagus.  -This does not sound like GERD and patient has not had any improvement with Zantac. Advised to discontinue Zantac -Patient will speak with his rheumatologist about holding psoriatic arthritis medication to see if symptoms improve before embarking on a major work-up (imaging, endoscopy, etc). -In the interim it is not unreasonable to proceed with a barium swallow with tablet to rule out any gross lesions, especially since he has a [facility] of esophageal problems in both parents  2. Colon cancer screening.  Negative Cologuard a year ago.  No family history colon cancer, no alarm symptoms. -Next Cologuard would be due in a couple of years , patient will consider colonoscopy instead.   Addendum: Reviewed and agree with initial management. Ixekizumab is an IL-17 Ab biologic that can be associated with oral candidiasis and other infection.  If symptoms fail to improve would consider EGD to exclude infectious esophagitis. Chase Oneal, Chase Caddy, MD    HPI:     Patient is a 52 year old male with hx of GERD and psoriatic arthritis on biologic.  Chase Oneal is here with complaints of chest discomfort which he feels may be originating from his esophagus.  The non-exertional discomfort started about a month ago. He describes constant tightness in chest.  Taking a deep breath exacerbates discomfort. Food nor food temperatures affect the discomfort.  The discomfort has awoken him from sleep.  He has no palpitations and no associated SOB. In addition to constant chest tightness he is aware of food passing through his esophagus.  He adamantly denies dysphagia or odynophagia.  He has no sensation of choking nor does he feel need to eat slower, just aware of food going down. Patient has a history of GERD treated with Prilosec several years ago.  He dislikes taking meds so eliminated dairy from diet and GERD symptoms resolved. Asymptomatic off meds for years.  Recently started on Zantac to see if chest discomfort could be GERD related but nothing improved.  Patient wonders if arthritis medication may be causing the chest discomfort.  He was on one medication but it had unwanted side effects so switched to Ixekizumab several months ago.  Since then he is had problems with itching and now this chest discomfort.  Even so, patient says both of his parents have had their esophagus stretched and he wants to be sure there is no esophageal component to his chest discomfort.  Patient has no lower GI complaints.  He had a negative Cologuard a year ago.  No family history colon cancer.  Past Medical History:  Diagnosis Date  . Arthritis   . GERD (gastroesophageal reflux disease)   . Sleep apnea      Past Surgical History:  Procedure Laterality Date  . HERNIA REPAIR  2015  . NASAL SEPTUM SURGERY  20 yrs ago   Family History  Problem Relation Age of Onset  . Arthritis Mother   . Sleep apnea Mother    Social History   Tobacco Use  . Smoking status: Never Smoker  . Smokeless tobacco: Never Used  Substance Use Topics  . Alcohol use: Yes    Alcohol/week: 6.0 standard drinks  Types: 6 Glasses of wine per week    Comment: weekly  . Drug use: No   Current Outpatient Medications  Medication Sig Dispense Refill  . doxepin (SINEQUAN) 10 MG capsule Take 1 capsule (10 mg total) by mouth at bedtime. 90 capsule 1  . Ixekizumab 80 MG/ML SOSY Inject into the skin.    . ranitidine (ZANTAC) 150 MG tablet Take 1 tablet (150 mg total) by mouth 2 (two) times daily. 60 tablet 0   No current facility-administered medications for this visit.    No Known Allergies   Review of  Systems: Positive for arthritis, itching, skin rash . All other systems reviewed and negative except where noted in HPI.   Serum creatinine: 1.02 mg/dL 40/98/1109/17/19 [id number] Estimated creatinine clearance: 85.5 mL/min   Physical Exam:    Wt Readings from Last 3 Encounters:  08/26/18 187 lb (84.8 kg)  08/19/18 177 lb (80.3 kg)  07/01/18 177 lb 1.3 oz (80.3 kg)    BP 130/80   Pulse 64   Ht 5\' 10"  (1.778 m)   Wt 187 lb (84.8 kg)   BMI 26.83 kg/m  Constitutional:  Pleasant male in no acute distress. Psychiatric: Normal mood and affect. Behavior is normal. EENT: Pupils normal.  Conjunctivae are normal. No scleral icterus. Neck supple.  Cardiovascular: Normal rate, regular rhythm. No edema Pulmonary/chest: Effort normal and breath sounds normal. No wheezing, rales or rhonchi. Abdominal: Soft, nondistended, nontender. Bowel sounds active throughout. There are no masses palpable. No hepatomegaly. Neurological: Alert and oriented to person place and time. Skin: Skin is warm and dry. No rashes noted.  Chase ClusterPaula Guenther, NP  08/26/2018, 3:56 PM  Cc: Chase GrandchildJones, Chase L, MD

## 2018-09-15 ENCOUNTER — Ambulatory Visit (HOSPITAL_COMMUNITY)
Admission: RE | Admit: 2018-09-15 | Discharge: 2018-09-15 | Disposition: A | Discharge status: Home / Self Care | Payer: BLUE CROSS/BLUE SHIELD | Source: Ambulatory Visit | Attending: Nurse Practitioner | Admitting: Nurse Practitioner

## 2018-09-15 DIAGNOSIS — R0789 Other chest pain: Secondary | ICD-10-CM | POA: Insufficient documentation

## 2018-09-15 DIAGNOSIS — K224 Dyskinesia of esophagus: Secondary | ICD-10-CM | POA: Diagnosis not present

## 2018-09-15 DIAGNOSIS — Z8379 Family history of other diseases of the digestive system: Secondary | ICD-10-CM | POA: Insufficient documentation

## 2018-09-15 DIAGNOSIS — K219 Gastro-esophageal reflux disease without esophagitis: Secondary | ICD-10-CM | POA: Diagnosis not present

## 2018-09-15 DIAGNOSIS — K225 Diverticulum of esophagus, acquired: Secondary | ICD-10-CM | POA: Insufficient documentation

## 2018-09-18 DIAGNOSIS — L659 Nonscarring hair loss, unspecified: Secondary | ICD-10-CM | POA: Diagnosis not present

## 2018-09-18 DIAGNOSIS — M2550 Pain in unspecified joint: Secondary | ICD-10-CM | POA: Diagnosis not present

## 2018-09-18 DIAGNOSIS — L4059 Other psoriatic arthropathy: Secondary | ICD-10-CM | POA: Diagnosis not present

## 2018-11-10 DIAGNOSIS — L659 Nonscarring hair loss, unspecified: Secondary | ICD-10-CM | POA: Diagnosis not present

## 2018-11-10 DIAGNOSIS — L299 Pruritus, unspecified: Secondary | ICD-10-CM | POA: Diagnosis not present

## 2018-11-10 DIAGNOSIS — M2550 Pain in unspecified joint: Secondary | ICD-10-CM | POA: Diagnosis not present

## 2018-11-10 DIAGNOSIS — L4059 Other psoriatic arthropathy: Secondary | ICD-10-CM | POA: Diagnosis not present

## 2018-12-15 ENCOUNTER — Ambulatory Visit (INDEPENDENT_AMBULATORY_CARE_PROVIDER_SITE_OTHER): Payer: BLUE CROSS/BLUE SHIELD | Admitting: Family

## 2018-12-15 ENCOUNTER — Encounter: Payer: Self-pay | Admitting: Family

## 2018-12-15 VITALS — BP 140/86 | HR 84 | Temp 98.7°F | Ht 70.0 in

## 2018-12-15 DIAGNOSIS — R6889 Other general symptoms and signs: Secondary | ICD-10-CM

## 2018-12-15 LAB — POC INFLUENZA A&B (BINAX/QUICKVUE)
INFLUENZA A, POC: NEGATIVE
Influenza B, POC: NEGATIVE

## 2018-12-15 MED ORDER — HYDROCODONE-HOMATROPINE 5-1.5 MG/5ML PO SYRP: 5.0000 mL | ORAL_SOLUTION | Freq: Three times a day (TID) | ORAL | 0 refills | Status: DC | PRN

## 2018-12-15 MED ORDER — OSELTAMIVIR PHOSPHATE 75 MG PO CAPS: 75.0000 mg | ORAL_CAPSULE | Freq: Two times a day (BID) | ORAL | 0 refills | Status: DC

## 2018-12-15 NOTE — Progress Notes (Signed)
  Chase Oneal is a 53 y.o. male with the following history as recorded in Epic[employer]:  Patient Active Problem List   Diagnosis Date Noted  . Severe itching 08/19/2018  . 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 Esophageal dysphagia 08/19/2018  . Psoriatic arthritis (HCC) 06/18/2018  . Psoriasis 06/18/2018  . Hyperglycemia 06/17/2017  . Hypertension 05/20/2017  . Hypersomnia 02/02/2016  . Chronic bronchitis (HCC) 02/02/2016  . OSA (obstructive sleep apnea) 01/11/2016  . Routine general medical examination at a health care facility 12/14/2015  . Colon cancer screening 12/14/2015  . Bilateral inguinal hernia (BIH), L>>R 05/06/2012  . ERECTILE DYSFUNCTION, ORGANIC 11/22/2009  . GERD 07/04/2009    Current Outpatient Medications  Medication Sig Dispense Refill  . Ixekizumab 80 MG/ML SOSY Inject into the skin.     No current facility-administered medications for this visit.     Allergies: Patient has no known allergies.  Past Medical History:  Diagnosis Date  . Arthritis   . GERD (gastroesophageal reflux disease)   . Sleep apnea     Past Surgical History:  Procedure Laterality Date  . HERNIA REPAIR  2015  . NASAL SEPTUM SURGERY  20 yrs ago    Family History  Problem Relation Age of Onset  . Arthritis Mother   . Sleep apnea Mother     Social History   Tobacco Use  . Smoking status: Never Smoker  . Smokeless tobacco: Never Used  Substance Use Topics  . Alcohol use: Yes    Alcohol/week: 6.0 standard drinks    Types: 6 Glasses of wine per week    Comment: weekly    Subjective:  Patient started yesterday with flu-like symptoms; sudden onset of symptoms- body aches, headache, sore throat, cough; cough is keeping awake at night;  Did not take flu shot this year;     Objective:  Vitals:   12/15/18 1117  BP: 140/86  Pulse: 84  Temp: 98.7 F (37.1 C)  TempSrc: Oral  SpO2: 94%  Height: 5\' 10"  (1.778 m)    General: Well developed, well nourished, in no acute distress  Skin :  Warm and dry.  Head: Normocephalic and atraumatic  Eyes: Sclera and conjunctiva clear; pupils round and reactive to light; extraocular movements intact  Ears: External normal; canals clear; tympanic membranes normal  Oropharynx: Pink, supple. No suspicious lesions  Neck: Supple without thyromegaly, adenopathy  Lungs: Respirations unlabored; clear to auscultation bilaterally without wheeze, rales, rhonchi  CVS exam: normal rate and regular rhythm.  Neurologic: Alert and oriented; speech intact; face symmetrical; moves all extremities well; CNII-XII intact without focal deficit  Assessment:  1. Flu-like symptoms     Plan:  Rapid flu is negative but based on symptoms, will go ahead and treat; Rx for Tamiflu 75 mg bid x 5 days, Hycodan cough syrup as needed; increase fluids, rest and follow-up worse, no better.    No follow-ups on file.  Orders Placed This Encounter  Procedures  . POC Influenza A&B (Binax test)    Requested Prescriptions    No prescriptions requested or ordered in this encounter

## 2018-12-23 DIAGNOSIS — H524 Presbyopia: Secondary | ICD-10-CM | POA: Diagnosis not present

## 2018-12-23 DIAGNOSIS — H5203 Hypermetropia, bilateral: Secondary | ICD-10-CM | POA: Diagnosis not present

## 2019-02-10 DIAGNOSIS — L299 Pruritus, unspecified: Secondary | ICD-10-CM | POA: Diagnosis not present

## 2019-02-10 DIAGNOSIS — L659 Nonscarring hair loss, unspecified: Secondary | ICD-10-CM | POA: Diagnosis not present

## 2019-02-10 DIAGNOSIS — M2550 Pain in unspecified joint: Secondary | ICD-10-CM | POA: Diagnosis not present

## 2019-02-10 DIAGNOSIS — L4059 Other psoriatic arthropathy: Secondary | ICD-10-CM | POA: Diagnosis not present

## 2019-04-07 DIAGNOSIS — D485 Neoplasm of uncertain behavior of skin: Secondary | ICD-10-CM | POA: Diagnosis not present

## 2019-04-07 DIAGNOSIS — L409 Psoriasis, unspecified: Secondary | ICD-10-CM | POA: Diagnosis not present

## 2019-06-11 DIAGNOSIS — L409 Psoriasis, unspecified: Secondary | ICD-10-CM | POA: Diagnosis not present

## 2019-06-11 DIAGNOSIS — L4050 Arthropathic psoriasis, unspecified: Secondary | ICD-10-CM | POA: Diagnosis not present

## 2019-06-11 DIAGNOSIS — C44612 Basal cell carcinoma of skin of right upper limb, including shoulder: Secondary | ICD-10-CM | POA: Diagnosis not present

## 2019-07-09 DIAGNOSIS — C44619 Basal cell carcinoma of skin of left upper limb, including shoulder: Secondary | ICD-10-CM | POA: Diagnosis not present

## 2019-08-13 DIAGNOSIS — L299 Pruritus, unspecified: Secondary | ICD-10-CM | POA: Diagnosis not present

## 2019-08-13 DIAGNOSIS — M2550 Pain in unspecified joint: Secondary | ICD-10-CM | POA: Diagnosis not present

## 2019-08-13 DIAGNOSIS — L659 Nonscarring hair loss, unspecified: Secondary | ICD-10-CM | POA: Diagnosis not present

## 2019-08-13 DIAGNOSIS — L4059 Other psoriatic arthropathy: Secondary | ICD-10-CM | POA: Diagnosis not present

## 2019-10-08 DIAGNOSIS — L409 Psoriasis, unspecified: Secondary | ICD-10-CM | POA: Diagnosis not present

## 2019-10-08 DIAGNOSIS — D485 Neoplasm of uncertain behavior of skin: Secondary | ICD-10-CM | POA: Diagnosis not present

## 2019-10-10 DIAGNOSIS — M791 Myalgia, unspecified site: Secondary | ICD-10-CM | POA: Diagnosis not present

## 2019-10-10 DIAGNOSIS — R519 Headache, unspecified: Secondary | ICD-10-CM | POA: Diagnosis not present

## 2019-10-10 DIAGNOSIS — Z20828 Contact with and (suspected) exposure to other viral communicable diseases: Secondary | ICD-10-CM | POA: Diagnosis not present

## 2020-06-09 ENCOUNTER — Telehealth (INDEPENDENT_AMBULATORY_CARE_PROVIDER_SITE_OTHER): Payer: No Typology Code available for payment source | Admitting: Internal Medicine

## 2020-06-09 DIAGNOSIS — J0110 Acute frontal sinusitis, unspecified: Secondary | ICD-10-CM | POA: Diagnosis not present

## 2020-06-09 MED ORDER — AMOXICILLIN-POT CLAVULANATE 875-125 MG PO TABS: 1.0000 | ORAL_TABLET | Freq: Two times a day (BID) | ORAL | 0 refills | Status: DC

## 2020-06-09 NOTE — Progress Notes (Signed)
Virtual Visit via Video Note  I connected with Chase Oneal on 06/09/20 at  3:20 PM EDT by a video enabled telemedicine application and verified that I am speaking with the correct person using two identifiers.  The patient and the provider were at separate locations throughout the entire encounter. Patient location: home, Provider location: work   I discussed the limitations of evaluation and management by telemedicine and the availability of in person appointments. The patient expressed understanding and agreed to proceed. The patient and the provider were the only parties present for the visit unless noted in HPI below.  History of Present Illness: The patient is a 54 y.o. man with visit for sore throat. Started about 1 week or more ago. Has some drainage as well as cough. The cough is not bad and voice is hoarse. Denies SOB. Denies fevers or chills. Has been vaccinated against covid-19. Denies nausea or vomiting. Overall it is worsening. Has tried otc allergy medication which has not helped  Observations/Objective: Appearance: normal, voice hoarse, breathing appears normal, some coughing during visit, casual grooming, abdomen does not appear distended, throat with drainage, pain to self palpation of the frontal sinuses, memory normal, mental status is A and O times 3  Assessment and Plan: See problem oriented charting  Follow Up Instructions: rx augmentin  I discussed the assessment and treatment plan with the patient. The patient was provided an opportunity to ask questions and all were answered. The patient agreed with the plan and demonstrated an understanding of the instructions.   The patient was advised to call back or seek an in-person evaluation if the symptoms worsen or if the condition fails to improve as anticipated.  Myrlene Broker, MD

## 2020-06-10 ENCOUNTER — Encounter: Payer: Self-pay | Admitting: Internal Medicine

## 2020-06-10 DIAGNOSIS — J019 Acute sinusitis, unspecified: Secondary | ICD-10-CM | POA: Insufficient documentation

## 2020-06-10 NOTE — Assessment & Plan Note (Signed)
Rx augmentin. Advised to keep taking otc allergy medication.

## 2020-08-12 ENCOUNTER — Other Ambulatory Visit: Payer: Self-pay | Admitting: *Deleted

## 2020-08-12 MED ORDER — BETAMETHASONE DIPROPIONATE 0.05 % EX LOTN: TOPICAL_LOTION | Freq: Every day | CUTANEOUS | 3 refills | Status: DC

## 2020-09-07 LAB — NOVEL CORONAVIRUS, NAA: SARS-CoV-2, NAA: NEGATIVE

## 2021-07-04 ENCOUNTER — Encounter: Payer: Self-pay | Admitting: Internal Medicine

## 2021-11-21 ENCOUNTER — Encounter: Payer: Self-pay | Admitting: Dermatology

## 2021-11-21 ENCOUNTER — Other Ambulatory Visit: Payer: Self-pay

## 2021-11-21 ENCOUNTER — Ambulatory Visit (INDEPENDENT_AMBULATORY_CARE_PROVIDER_SITE_OTHER): Payer: 59 | Admitting: Dermatology

## 2021-11-21 DIAGNOSIS — L409 Psoriasis, unspecified: Secondary | ICD-10-CM | POA: Diagnosis not present

## 2021-11-21 DIAGNOSIS — L408 Other psoriasis: Secondary | ICD-10-CM

## 2021-11-21 MED ORDER — HYDROCORTISONE 1 % EX LOTN: TOPICAL_LOTION | Freq: Every day | CUTANEOUS | Status: AC

## 2021-11-21 MED ORDER — CLOBETASOL PROPIONATE 0.05 % EX FOAM: Freq: Every day | CUTANEOUS | 5 refills | Status: DC

## 2021-11-21 NOTE — Patient Instructions (Addendum)
PATIENT WILL PICK UP OVER THE COUNTER CERAVE ITCH RELIEF CREAM AND AMAZON TARSUM SHAMPOO CLOBETASOL IS FOR THE SCALP  PATIENT WILL SPEAK TO DR. Dierdre Forth ABOUT TALTZ VS SKYRIZI PATIENT WILL CANCEL FOLLOW UP APPOINTMENT IS IMPROVEMENT IS NOTICED

## 2021-11-21 NOTE — Progress Notes (Signed)
Jiaire Hane Key: [id number] - PA Case ID: [id number] - Rx #: [id number] Need help? Call us at [phone] Status Sent to [facility] Clobetasol Propionate 0.05% foam Form [employer] ePA Form 2017 NCPDP Original Claim Info MR TRANS FEE = 0.00DRUG NOT IN FORMULARY; PA REQUESTS: [phone]

## 2021-12-13 ENCOUNTER — Other Ambulatory Visit: Payer: Self-pay | Admitting: Dermatology

## 2021-12-13 ENCOUNTER — Telehealth: Payer: Self-pay | Admitting: Dermatology

## 2021-12-13 DIAGNOSIS — L408 Other psoriasis: Secondary | ICD-10-CM

## 2021-12-13 MED ORDER — CLOBETASOL PROPIONATE 0.05 % EX SOLN: 1.0000 "application " | Freq: Two times a day (BID) | CUTANEOUS | 2 refills | Status: DC

## 2021-12-13 NOTE — Telephone Encounter (Signed)
Phone call to patient to let him know that I sent in refill for the clobetasol scalp solution instead of foam.

## 2021-12-13 NOTE — Telephone Encounter (Signed)
Patient is calling to say that the last prescription he got was for Clobetasol Foam clobetasol clobetasol (OLUX) 0.05 % topical foam.  Patient states that the prescriptions he got before that were for a liquid instead of a foam and that the liquid worked much better and went further as well, so he would like to have the liquid sent in to his pharmacy.  [facility] [place], [place] - [address] (Ph: [phone]

## 2021-12-14 ENCOUNTER — Encounter: Payer: Self-pay | Admitting: Dermatology

## 2021-12-14 NOTE — Progress Notes (Signed)
° °  Follow-Up Visit   Subjective  Chase Oneal is a 57 y.o. male who presents for the following: Skin Problem (Itchy scalp x 2 years- needs refill on betamethasone lotion- dr Amil Amen follows him or psoriatic arthritis - tx taltz ).  Psoriasis including itchy scalp Location:  Duration:  Quality:  Associated Signs/Symptoms: Modifying Factors:  Severity:  Timing: Context:   Objective  Well appearing patient in no apparent distress; mood and affect are within normal limits. Scalp Arthritis generally controlled with ixekizumab, skin not completely clear.   Left Axilla, Pubic, Right Axilla Suspect due to patient's intertriginous involvement represents adverse psoriasis or so-called SEBO psoriasis.  He may use hydrocortisone on these areas.     A full examination was performed including scalp, head, eyes, ears, nose, lips, neck, chest, axillae, abdomen, back, buttocks, bilateral upper extremities, bilateral lower extremities, hands, feet, fingers, toes, fingernails, and toenails. All findings within normal limits unless otherwise noted below.   Assessment & Plan    Psoriasis Scalp  Discussed pros and cons of Taltz vs Skyrizi, pt needs to speak to Dr. Amil Amen.  For now we will add topical clobetasol except for the face where he can use hydrocortisone until a decision on systemic therapy is made.  Related Medications hydrocortisone 1 % lotion   Inverse psoriasis Pubic; Left Axilla; Right Axilla  Discussed Taltz vs Skyrizi  Clobetasol foam for the scalp   Related Medications hydrocortisone 1 % lotion   clobetasol (OLUX) 0.05 % topical foam Apply topically daily. APPLY TO THE SCALP DAILY      I, Lavonna Monarch, MD, have reviewed all documentation for this visit.  The documentation on 12/14/21 for the exam, diagnosis, procedures, and orders are all accurate and complete.

## 2022-01-03 ENCOUNTER — Ambulatory Visit: Payer: 59 | Admitting: Dermatology

## 2022-02-02 ENCOUNTER — Encounter (HOSPITAL_BASED_OUTPATIENT_CLINIC_OR_DEPARTMENT_OTHER): Payer: Self-pay | Admitting: Otolaryngology

## 2022-02-03 ENCOUNTER — Other Ambulatory Visit: Payer: Self-pay | Admitting: Otolaryngology

## 2022-02-05 ENCOUNTER — Encounter (HOSPITAL_BASED_OUTPATIENT_CLINIC_OR_DEPARTMENT_OTHER): Payer: Self-pay | Admitting: Otolaryngology

## 2022-02-06 ENCOUNTER — Other Ambulatory Visit: Payer: Self-pay | Admitting: Otolaryngology

## 2022-02-12 NOTE — Anesthesia Preprocedure Evaluation (Signed)
Anesthesia Evaluation    Reviewed: Allergy & Precautions, Patient's Chart, lab work & pertinent test results  Airway        Dental   Pulmonary sleep apnea ,           Cardiovascular hypertension,      Neuro/Psych negative neurological ROS     GI/Hepatic GERD  ,  Endo/Other    Renal/GU      Musculoskeletal  (+) Arthritis ,   Abdominal   Peds  Hematology   Anesthesia Other Findings   Reproductive/Obstetrics                             Anesthesia Physical Anesthesia Plan  ASA: 2  Anesthesia Plan: MAC   Post-op Pain Management:    Induction: Intravenous  PONV Risk Score and Plan: 2 and Treatment may vary due to age or medical condition and Ondansetron  Airway Management Planned: Natural Airway and Nasal Cannula  Additional Equipment:   Intra-op Plan:   Post-operative Plan:   Informed Consent:     Dental advisory given  Plan Discussed with:   Anesthesia Plan Comments:         Anesthesia Quick Evaluation

## 2022-02-13 ENCOUNTER — Ambulatory Visit (HOSPITAL_BASED_OUTPATIENT_CLINIC_OR_DEPARTMENT_OTHER)
Admission: RE | Admit: 2022-02-13 | Discharge: 2022-02-13 | Disposition: A | Discharge status: Home / Self Care | Payer: 59 | Attending: Otolaryngology | Admitting: Otolaryngology

## 2022-02-13 ENCOUNTER — Ambulatory Visit (HOSPITAL_BASED_OUTPATIENT_CLINIC_OR_DEPARTMENT_OTHER): Payer: 59 | Admitting: Anesthesiology

## 2022-02-13 ENCOUNTER — Other Ambulatory Visit: Payer: Self-pay

## 2022-02-13 ENCOUNTER — Encounter (HOSPITAL_BASED_OUTPATIENT_CLINIC_OR_DEPARTMENT_OTHER)
Admission: RE | Disposition: A | Discharge status: Home / Self Care | Payer: Self-pay | Source: Home / Self Care | Attending: Otolaryngology

## 2022-02-13 ENCOUNTER — Encounter (HOSPITAL_BASED_OUTPATIENT_CLINIC_OR_DEPARTMENT_OTHER): Payer: Self-pay | Admitting: Otolaryngology

## 2022-02-13 DIAGNOSIS — M1990 Unspecified osteoarthritis, unspecified site: Secondary | ICD-10-CM | POA: Diagnosis not present

## 2022-02-13 DIAGNOSIS — G4733 Obstructive sleep apnea (adult) (pediatric): Secondary | ICD-10-CM | POA: Insufficient documentation

## 2022-02-13 HISTORY — PX: DRUG INDUCED ENDOSCOPY: SHX6808

## 2022-02-13 SURGERY — DRUG INDUCED SLEEP ENDOSCOPY
Anesthesia: Monitor Anesthesia Care

## 2022-02-13 MED ORDER — ONDANSETRON HCL 4 MG/2ML IJ SOLN: 4.0000 mg | Freq: Once | INTRAMUSCULAR | Status: DC | PRN

## 2022-02-13 MED ORDER — LIDOCAINE 2% (20 MG/ML) 5 ML SYRINGE
INTRAMUSCULAR | Status: DC | PRN
  Administered 2022-02-13: 60 mg via INTRAVENOUS

## 2022-02-13 MED ORDER — ACETAMINOPHEN 10 MG/ML IV SOLN: 1000.0000 mg | Freq: Once | INTRAVENOUS | Status: DC | PRN

## 2022-02-13 MED ORDER — LACTATED RINGERS IV SOLN: INTRAVENOUS | Status: DC

## 2022-02-13 MED ORDER — FENTANYL CITRATE (PF) 100 MCG/2ML IJ SOLN: 25.0000 ug | INTRAMUSCULAR | Status: DC | PRN

## 2022-02-13 MED ORDER — PROPOFOL 500 MG/50ML IV EMUL
INTRAVENOUS | Status: DC | PRN
Start: 2022-02-13 — End: 2022-02-13
  Administered 2022-02-13: 35 ug/kg/min via INTRAVENOUS

## 2022-02-13 SURGICAL SUPPLY — 14 items
CANISTER SUCT 1200ML W/VALVE (MISCELLANEOUS) ×3 IMPLANT
GLOVE SURG ENC TEXT LTX SZ7 (GLOVE) ×3 IMPLANT
KIT CLEAN ENDO (MISCELLANEOUS) ×3 IMPLANT
NDL HYPO 27GX1-1/4 (NEEDLE) IMPLANT
NEEDLE HYPO 27GX1-1/4 (NEEDLE) IMPLANT
PACK BASIN DAY SURGERY FS (CUSTOM PROCEDURE TRAY) ×3 IMPLANT
PATTIES SURGICAL .5 X3 (DISPOSABLE) IMPLANT
SHEET MEDIUM DRAPE 40X70 STRL (DRAPES) ×3 IMPLANT
SOL ANTI FOG 6CC (MISCELLANEOUS) ×2 IMPLANT
SOLUTION ANTI FOG 6CC (MISCELLANEOUS) ×1
SPONGE NEURO XRAY DETECT 1X3 (DISPOSABLE) IMPLANT
SYR CONTROL 10ML LL (SYRINGE) IMPLANT
TOWEL GREEN STERILE FF (TOWEL DISPOSABLE) ×3 IMPLANT
TUBE CONNECTING 20X1/4 (TUBING) IMPLANT

## 2022-02-13 NOTE — Transfer of Care (Signed)
Immediate Anesthesia Transfer of Care Note ? ?Patient: Chase Oneal ? ?Procedure(s) Performed: DRUG INDUCED SLEEP ENDOSCOPY ? ?Patient Location: PACU ? ?Anesthesia Type:MAC ? ?Level of Consciousness: awake, alert , oriented and patient cooperative ? ?Airway & Oxygen Therapy: Patient Spontanous Breathing ? ?Post-op Assessment: Report given to RN and Post -op Vital signs reviewed and stable ? ?Post vital signs: Reviewed and stable ? ?Last Vitals:  ?Vitals Value Taken Time  ?BP    ?Temp    ?Pulse    ?Resp    ?SpO2    ? ? ?Last Pain:  ?Vitals:  ? 02/13/22 1108  ?TempSrc: Oral  ?PainSc: 0-No pain  ?   ? ?Patients Stated Pain Goal: 0 (02/13/22 1108) ? ?Complications: No notable events documented. ?

## 2022-02-13 NOTE — Discharge Instructions (Signed)

## 2022-02-13 NOTE — Anesthesia Postprocedure Evaluation (Signed)
Anesthesia Post Note ? ?Patient: Chase Oneal ? ?Procedure(s) Performed: DRUG INDUCED SLEEP ENDOSCOPY ? ?  ? ?Patient location during evaluation: PACU ?Anesthesia Type: MAC ?Level of consciousness: awake and alert ?Pain management: pain level controlled ?Vital Signs Assessment: post-procedure vital signs reviewed and stable ?Respiratory status: spontaneous breathing, nonlabored ventilation, respiratory function stable and patient connected to nasal cannula oxygen ?Cardiovascular status: stable and blood pressure returned to baseline ?Postop Assessment: no apparent nausea or vomiting ?Anesthetic complications: no ? ? ?No notable events documented. ? ?Last Vitals:  ?Vitals:  ? 02/13/22 1245 02/13/22 1305  ?BP: (!) 146/119 (!) 149/99  ?Pulse: 66 (!) 58  ?Resp: 11 17  ?Temp:  36.6 ?C  ?SpO2: 97% 100%  ?  ?Last Pain:  ?Vitals:  ? 02/13/22 1305  ?TempSrc:   ?PainSc: 0-No pain  ? ? ?  ?  ?  ?  ?  ?  ? ?[place]et [place] ? ? ? ? ?

## 2022-02-13 NOTE — Anesthesia Procedure Notes (Signed)
Procedure Name: Onset ?Date/Time: 02/13/2022 12:36 PM ?Performed by: Signe Colt, CRNA ?Pre-anesthesia Checklist: Patient identified, Emergency Drugs available, Suction available, Patient being monitored and Timeout performed ?Patient Re-evaluated:Patient Re-evaluated prior to induction ?Oxygen Delivery Method: Simple face mask ? ? ? ? ?

## 2022-02-13 NOTE — Op Note (Signed)
Operative Note: DRUG INDUCED SLEEP ENDOSCOPY ? ?Patient: Chase Oneal ? ?Medical record number: [id number] ? ?Date:02/13/2022 ? ?Pre-operative Indications: 1.  Obstructive Sleep Apnea ? ?Postoperative Indications: Same ? ?Surgical Procedure: 1.  Drug Induced Sleep Endoscopy (DISE) ? ?Anesthesia: MAC with IV sedation ? ?Surgeon: Delsa Bern, M.D. ? ?Complications: None ? ?BMI: 25.8 kg/m? ? ?EBL: None ? ?Findings: There is no evidence of complete concentric palatal obstruction.  Anatomically the patient should be a candidate for hypoglossal nerve stimulation therapy. ? ?  ? ?Brief History: ?The patient is a 56 y.o. male with a history of obstructive sleep apnea. The patient has undergone previous sleep study which showed mild to moderate levels of obstructive sleep apnea.  The patient was prescribed CPAP which they consistently attempted to use without success.  Given the patient's history and findings, the above drug-induced sleep endoscopy was recommended to assess the patient's anatomic level of apnea.  Risks and benefits were discussed in detail with the patient today understand and agree with our plan for surgery which is scheduled at [facility] under sedated anesthesia as an outpatient. ? ?Surgical Procedure: ?The patient is brought to the operating room on 02/13/2022 and placed in supine position on the operating table.  Intravenous sedated anesthesia was established without difficulty using the standard drug-induced sleep endoscopy protocol. When the patient was adequately anesthetized, surgical timeout was performed and correct identification of the patient and the surgical procedure.   ? ?A propofol infusion was administered and the patient was monitored carefully to achieve a level of sedation appropriate for DISE.  The patient did not respond to verbal commands but still had spontaneous respiration, sleep disordered breathing and associated desaturations were observed. ? ?With the patient under adequate  sedated anesthesia the flexible nasal laryngoscope was passed without difficulty.  The patient's nasal cavity showed patent nasal passageway after previous surgery.  The endoscope was then passed to visualize the velopharynx, oropharynx, tongue base and epiglottis to assess areas of obstruction.  Patient's airway showed significant anterior to posterior obstruction at the level of the palate and tongue base.   There was no evidence of complete concentric palatal obstruction and the patient appeared to be a candidate anatomically for hypoglossal nerve stimulation therapy. ? ?Surgical sponge count was correct. Patient was awakened from anesthetic and transferred from the operating room to the recovery room in stable condition. There were no complications and no blood loss. ? ? ?Delsa Bern, M.D. ?[facility] ?02/13/2022  ?

## 2022-02-13 NOTE — H&P (Signed)
Chase Oneal is an 56 y.o. male.   ?Chief Complaint: Obstructive sleep apnea ?HPI: Patient with a history of OSA and chronic sleep disturbance.  Unable to tolerate CPAP as a long-term treatment for sleep apnea. ? ?Past Medical History:  ?Diagnosis Date  ? Arthritis   ? psoriatic  ? GERD (gastroesophageal reflux disease)   ? Sleep apnea   ? ? ?Past Surgical History:  ?Procedure Laterality Date  ? HERNIA REPAIR  2015  ? NASAL SEPTUM SURGERY  20 yrs ago  ? ? ?Family History  ?Problem Relation Age of Onset  ? Arthritis Mother   ? Sleep apnea Mother   ? ?Social History:  reports that he has never smoked. He has never used smokeless tobacco. He reports current alcohol use of about 6.0 standard drinks per week. He reports that he does not use drugs. ? ?Allergies: No Known Allergies ? ?Facility-Administered Medications Prior to Admission  ?Medication Dose Route Frequency Provider Last Rate Last Admin  ? hydrocortisone 1 % lotion   Topical Daily Lavonna Monarch, MD      ? ?Medications Prior to Admission  ?Medication Sig Dispense Refill  ? Risankizumab-rzaa (SKYRIZI PEN) 150 MG/ML SOAJ Inject into the skin.    ? betamethasone dipropionate 0.05 % lotion Apply topically daily. Scalp at bedtime 45 mL 3  ? clobetasol (OLUX) 0.05 % topical foam Apply topically daily. APPLY TO THE SCALP DAILY 60 g 5  ? clobetasol (TEMOVATE) 0.05 % external solution Apply 1 application topically 2 (two) times daily. 50 mL 2  ? ? ?No results found for this or any previous visit (from the past 48 hour(s)). ?No results found. ? ?Review of Systems  ?Respiratory:  Positive for apnea.   ? ?Blood pressure (!) 142/91, pulse 64, temperature (!) 97.5 ?F (36.4 ?C), temperature source Oral, resp. rate 16, height 5\' 10"  (1.778 m), weight 81.7 kg, SpO2 98 %. ?Physical Exam ?Cardiovascular:  ?   Rate and Rhythm: Normal rate.  ?Pulmonary:  ?   Effort: Pulmonary effort is normal.  ?Musculoskeletal:  ?   Cervical back: Normal range of motion.  ?   ? ?Assessment/Plan ?Patient admitted for outpatient surgery: Drug-induced sleep endoscopy ? ?Jerrell Belfast, MD ?02/13/2022, 12:11 PM ? ? ? ?

## 2022-02-14 ENCOUNTER — Encounter (HOSPITAL_BASED_OUTPATIENT_CLINIC_OR_DEPARTMENT_OTHER): Payer: Self-pay | Admitting: Otolaryngology

## 2022-02-20 ENCOUNTER — Other Ambulatory Visit: Payer: Self-pay | Admitting: Otolaryngology

## 2022-03-15 NOTE — Progress Notes (Signed)
Surgical Instructions ? ? ? Your procedure is scheduled on Friday April 21st. ? Report to [facility] Main Entrance "A" at 5:30 A.M., then check in with the Admitting office. ? Call this number if you have problems the morning of surgery: ? [phone] ? ? If you have any questions prior to your surgery date call [phone]: Open Monday-Friday 8am-4pm ? ? ? Remember: ? Do not eat or drink after midnight the night before your surgery ? ?  ? Take these medicines the morning of surgery with A SIP OF WATER: ?cetirizine (ZYRTEC) 10 MG tablet ? ? ?As of today, STOP taking any Aspirin (unless otherwise instructed by your surgeon) Aleve, Naproxen, Ibuprofen, Motrin, Advil, Goody's, BC's, all herbal medications, fish oil, and all vitamins. ? ?         ?Do not wear jewelry ?Do not wear lotions, powders, colognes, or deodorant. ?Do not shave 48 hours prior to surgery.  Men may shave face and neck. ?Do not bring valuables to the hospital. ?Do not wear nail polish ? ?[facility] is not responsible for any belongings or valuables. .  ? ?Do NOT Smoke (Tobacco/Vaping)  24 hours prior to your procedure ? ?If you use a CPAP at night, you may bring your mask for your overnight stay. ?  ?Contacts, glasses, hearing aids, dentures or partials may not be worn into surgery, please bring cases for these belongings ?  ?For patients admitted to the hospital, discharge time will be determined by your treatment team. ?  ?Patients discharged the day of surgery will not be allowed to drive home, and someone needs to stay with them for 24 hours. ? ? ?SURGICAL WAITING ROOM VISITATION ?Patients having surgery or a procedure in a hospital may have two support people. ?Children under the age of 86 must have an adult with them who is not the patient. ?They may stay in the waiting area during the procedure and may switch out with other visitors. If the patient needs to stay at the hospital during part of their recovery, the visitor guidelines for  inpatient rooms apply. ? ?Please refer to the [facility] website for the visitor guidelines for Inpatients (after your surgery is over and you are in a regular room).  ? ? ? ? ? ?Special instructions:   ? ?Oral Hygiene is also important to reduce your risk of infection.  Remember - BRUSH YOUR TEETH THE MORNING OF SURGERY WITH YOUR REGULAR TOOTHPASTE ? ? ?[facility]- Preparing For Surgery ? ?Before surgery, you can play an important role. Because skin is not sterile, your skin needs to be as free of germs as possible. You can reduce the number of germs on your skin by washing with CHG (chlorahexidine gluconate) Soap before surgery.  CHG is an antiseptic cleaner which kills germs and bonds with the skin to continue killing germs even after washing.   ? ? ?Please do not use if you have an allergy to CHG or antibacterial soaps. If your skin becomes reddened/irritated stop using the CHG.  ?Do not shave (including legs and underarms) for at least 48 hours prior to first CHG shower. It is OK to shave your face. ? ?Please follow these instructions carefully. ?  ? ? Shower the NIGHT BEFORE SURGERY and the MORNING OF SURGERY with CHG Soap.  ? If you chose to wash your hair, wash your hair first as usual with your normal shampoo. After you shampoo, rinse your hair and body thoroughly to remove the shampoo.  Then [employer] and genitals (private parts) with your normal soap and rinse thoroughly to remove soap. ? ?After that Use CHG Soap as you would any other liquid soap. You can apply CHG directly to the skin and wash gently with a scrungie or a clean washcloth.  ? ?Apply the CHG Soap to your body ONLY FROM THE NECK DOWN.  Do not use on open wounds or open sores. Avoid contact with your eyes, ears, mouth and genitals (private parts). Wash Face and genitals (private parts)  with your normal soap.  ? ?Wash thoroughly, paying special attention to the area where your surgery will be performed. ? ?Thoroughly rinse your body with  warm water from the neck down. ? ?DO NOT shower/wash with your normal soap after using and rinsing off the CHG Soap. ? ?Pat yourself dry with a CLEAN TOWEL. ? ?Wear CLEAN PAJAMAS to bed the night before surgery ? ?Place CLEAN SHEETS on your bed the night before your surgery ? ?DO NOT SLEEP WITH PETS. ? ? ?Day of Surgery: ? ?Take a shower with CHG soap. ?Wear Clean/Comfortable clothing the morning of surgery ?Do not apply any deodorants/lotions.   ?Remember to brush your teeth WITH YOUR REGULAR TOOTHPASTE. ? ? ? ?If you received a COVID test during your pre-op visit  it is requested that you wear a mask when out in public, stay away from anyone that may not be feeling well and notify your surgeon if you develop symptoms. If you have been in contact with anyone that has tested positive in the last 10 days please notify you surgeon. ? ?  ?Please read over the following fact sheets that you were given.  ? ?

## 2022-03-16 ENCOUNTER — Encounter (HOSPITAL_COMMUNITY): Payer: Self-pay

## 2022-03-16 ENCOUNTER — Encounter (HOSPITAL_COMMUNITY)
Admission: RE | Admit: 2022-03-16 | Discharge: 2022-03-16 | Disposition: A | Discharge status: Home / Self Care | Payer: 59 | Source: Ambulatory Visit | Attending: Otolaryngology | Admitting: Otolaryngology

## 2022-03-16 ENCOUNTER — Other Ambulatory Visit: Payer: Self-pay

## 2022-03-16 VITALS — BP 148/97 | HR 73 | Temp 97.7°F | Resp 17 | Ht 70.0 in | Wt 179.7 lb

## 2022-03-16 DIAGNOSIS — G4733 Obstructive sleep apnea (adult) (pediatric): Secondary | ICD-10-CM | POA: Diagnosis not present

## 2022-03-16 DIAGNOSIS — Z01812 Encounter for preprocedural laboratory examination: Secondary | ICD-10-CM | POA: Insufficient documentation

## 2022-03-16 HISTORY — DX: Deviated nasal septum: J34.2

## 2022-03-16 LAB — CBC
HCT: 43.1 % (ref 39.0–52.0)
Hemoglobin: 15.9 g/dL (ref 13.0–17.0)
MCH: 34.8 pg — ABNORMAL HIGH (ref 26.0–34.0)
MCHC: 36.9 g/dL — ABNORMAL HIGH (ref 30.0–36.0)
MCV: 94.3 fL (ref 80.0–100.0)
Platelets: 177 10*3/uL (ref 150–400)
RBC: 4.57 MIL/uL (ref 4.22–5.81)
RDW: 11.5 % (ref 11.5–15.5)
WBC: 5.5 10*3/uL (ref 4.0–10.5)
nRBC: 0 % (ref 0.0–0.2)

## 2022-03-16 NOTE — Progress Notes (Signed)
PCP - [facility] ?Was with Corinda Gubler on [facility] Dr. Yetta Barre ?Cardiologist - Denies ? ?Chest x-ray - Not indicated ?EKG - Not indicated ?Stress Test - Denies ?ECHO - Denies ?Cardiac Cath - Denies ? ?Sleep Study - Yes has OSA ?CPAP - Cannot tolerate ? ?DM - Denies ? ?Anesthesia review: No ? ?Patient denies shortness of breath, fever, cough and chest pain at PAT appointment ? ? ?All instructions explained to the patient, with a verbal understanding of the material. Patient agrees to go over the instructions while at home for a better understanding. The opportunity to ask questions was provided. ? ? ?

## 2022-03-22 NOTE — Anesthesia Preprocedure Evaluation (Addendum)
Anesthesia Evaluation  ?Patient identified by MRN, date of birth, ID band ?Patient awake ? ? ? ?Reviewed: ?Allergy & Precautions, NPO status , Patient's Chart, lab work & pertinent test results ? ?Airway ?Mallampati: II ? ?TM Distance: <3 FB ?Neck ROM: Full ? ? ? Dental ? ?(+) Teeth Intact, Dental Advisory Given ?  ?Pulmonary ?sleep apnea ,  ?  ?Pulmonary exam normal ?breath sounds clear to auscultation ? ? ? ? ? ? Cardiovascular ?hypertension, Normal cardiovascular exam ?Rhythm:Regular Rate:Normal ? ? ?  ?Neuro/Psych ?negative neurological ROS ? negative psych ROS  ? GI/Hepatic ?Neg liver ROS, GERD  ,  ?Endo/Other  ?negative endocrine ROS ? Renal/GU ?negative Renal ROS  ? ?  ?Musculoskeletal ? ?(+) Arthritis ,  ? Abdominal ?  ?Peds ? Hematology ?negative hematology ROS ?(+)   ?Anesthesia Other Findings ? ? Reproductive/Obstetrics ? ?  ? ? ? ? ? ? ? ? ? ? ? ? ? ?  ?  ? ? ? ? ? ? ? ?Anesthesia Physical ?Anesthesia Plan ? ?ASA: 2 ? ?Anesthesia Plan: General  ? ?Post-op Pain Management: Tylenol PO (pre-op)*  ? ?Induction: Intravenous ? ?PONV Risk Score and Plan: 2 ? ?Airway Management Planned: Oral ETT ? ?Additional Equipment:  ? ?Intra-op Plan:  ? ?Post-operative Plan: Extubation in OR ? ?Informed Consent: I have reviewed the patients History and Physical, chart, labs and discussed the procedure including the risks, benefits and alternatives for the proposed anesthesia with the patient or authorized representative who has indicated his/her understanding and acceptance.  ? ? ? ?Dental advisory given ? ?Plan Discussed with: CRNA ? ?Anesthesia Plan Comments:   ? ? ? ? ? ?Anesthesia Quick Evaluation ? ?

## 2022-03-23 ENCOUNTER — Ambulatory Visit (HOSPITAL_COMMUNITY): Payer: 59 | Admitting: Anesthesiology

## 2022-03-23 ENCOUNTER — Ambulatory Visit (HOSPITAL_COMMUNITY): Payer: 59

## 2022-03-23 ENCOUNTER — Other Ambulatory Visit: Payer: Self-pay

## 2022-03-23 ENCOUNTER — Ambulatory Visit (HOSPITAL_BASED_OUTPATIENT_CLINIC_OR_DEPARTMENT_OTHER): Payer: 59 | Admitting: Anesthesiology

## 2022-03-23 ENCOUNTER — Ambulatory Visit (HOSPITAL_COMMUNITY)
Admission: RE | Admit: 2022-03-23 | Discharge: 2022-03-23 | Disposition: A | Discharge status: Home / Self Care | Payer: 59 | Attending: Otolaryngology | Admitting: Otolaryngology

## 2022-03-23 ENCOUNTER — Encounter (HOSPITAL_COMMUNITY)
Admission: RE | Disposition: A | Discharge status: Home / Self Care | Payer: Self-pay | Source: Home / Self Care | Attending: Otolaryngology

## 2022-03-23 ENCOUNTER — Encounter (HOSPITAL_COMMUNITY): Payer: Self-pay | Admitting: Otolaryngology

## 2022-03-23 DIAGNOSIS — K219 Gastro-esophageal reflux disease without esophagitis: Secondary | ICD-10-CM | POA: Insufficient documentation

## 2022-03-23 DIAGNOSIS — G4733 Obstructive sleep apnea (adult) (pediatric): Secondary | ICD-10-CM | POA: Diagnosis present

## 2022-03-23 DIAGNOSIS — G4730 Sleep apnea, unspecified: Secondary | ICD-10-CM | POA: Diagnosis not present

## 2022-03-23 DIAGNOSIS — I10 Essential (primary) hypertension: Secondary | ICD-10-CM | POA: Insufficient documentation

## 2022-03-23 DIAGNOSIS — Z9989 Dependence on other enabling machines and devices: Secondary | ICD-10-CM

## 2022-03-23 HISTORY — PX: IMPLANTATION OF HYPOGLOSSAL NERVE STIMULATOR: SHX6827

## 2022-03-23 SURGERY — INSERTION, HYPOGLOSSAL NERVE STIMULATOR
Anesthesia: General | Site: Neck | Laterality: Right

## 2022-03-23 MED ORDER — CHLORHEXIDINE GLUCONATE 0.12 % MT SOLN
15.0000 mL | Freq: Once | OROMUCOSAL | Status: AC
  Administered 2022-03-23: 15 mL via OROMUCOSAL
  Filled 2022-03-23: qty 15

## 2022-03-23 MED ORDER — SUCCINYLCHOLINE CHLORIDE 200 MG/10ML IV SOSY
PREFILLED_SYRINGE | INTRAVENOUS | Status: DC | PRN
  Administered 2022-03-23: 140 mg via INTRAVENOUS

## 2022-03-23 MED ORDER — LIDOCAINE 2% (20 MG/ML) 5 ML SYRINGE
INTRAMUSCULAR | Status: DC | PRN
  Administered 2022-03-23: 60 mg via INTRAVENOUS

## 2022-03-23 MED ORDER — CEFAZOLIN SODIUM-DEXTROSE 2-4 GM/100ML-% IV SOLN
2.0000 g | INTRAVENOUS | Status: AC
  Administered 2022-03-23: 2 g via INTRAVENOUS
  Filled 2022-03-23: qty 100

## 2022-03-23 MED ORDER — PHENYLEPHRINE 80 MCG/ML (10ML) SYRINGE FOR IV PUSH (FOR BLOOD PRESSURE SUPPORT)
PREFILLED_SYRINGE | INTRAVENOUS | Status: AC
  Filled 2022-03-23: qty 10

## 2022-03-23 MED ORDER — 0.9 % SODIUM CHLORIDE (POUR BTL) OPTIME
TOPICAL | Status: DC | PRN
  Administered 2022-03-23: 1000 mL

## 2022-03-23 MED ORDER — ACETAMINOPHEN 500 MG PO TABS
1000.0000 mg | ORAL_TABLET | Freq: Once | ORAL | Status: AC
  Administered 2022-03-23: 1000 mg via ORAL
  Filled 2022-03-23: qty 2

## 2022-03-23 MED ORDER — SUCCINYLCHOLINE CHLORIDE 200 MG/10ML IV SOSY
PREFILLED_SYRINGE | INTRAVENOUS | Status: AC
  Filled 2022-03-23: qty 10

## 2022-03-23 MED ORDER — DEXAMETHASONE SODIUM PHOSPHATE 10 MG/ML IJ SOLN
INTRAMUSCULAR | Status: DC | PRN
  Administered 2022-03-23: 10 mg via INTRAVENOUS

## 2022-03-23 MED ORDER — ONDANSETRON HCL 4 MG/2ML IJ SOLN
INTRAMUSCULAR | Status: AC
  Filled 2022-03-23: qty 2

## 2022-03-23 MED ORDER — MIDAZOLAM HCL 2 MG/2ML IJ SOLN
INTRAMUSCULAR | Status: AC
  Filled 2022-03-23: qty 2

## 2022-03-23 MED ORDER — EPHEDRINE 5 MG/ML INJ
INTRAVENOUS | Status: AC
  Filled 2022-03-23: qty 5

## 2022-03-23 MED ORDER — ONDANSETRON HCL 4 MG/2ML IJ SOLN: 4.0000 mg | Freq: Once | INTRAMUSCULAR | Status: DC | PRN

## 2022-03-23 MED ORDER — HYDROCODONE-ACETAMINOPHEN 5-325 MG PO TABS
ORAL_TABLET | ORAL | 0 refills | Status: DC
Start: 2022-03-23 — End: 2022-05-24

## 2022-03-23 MED ORDER — PROPOFOL 10 MG/ML IV BOLUS
INTRAVENOUS | Status: AC
  Filled 2022-03-23: qty 20

## 2022-03-23 MED ORDER — LIDOCAINE 2% (20 MG/ML) 5 ML SYRINGE
INTRAMUSCULAR | Status: AC
  Filled 2022-03-23: qty 5

## 2022-03-23 MED ORDER — EPHEDRINE SULFATE-NACL 50-0.9 MG/10ML-% IV SOSY
PREFILLED_SYRINGE | INTRAVENOUS | Status: DC | PRN
  Administered 2022-03-23: 10 mg via INTRAVENOUS
  Administered 2022-03-23 (×3): 5 mg via INTRAVENOUS

## 2022-03-23 MED ORDER — ONDANSETRON HCL 4 MG/2ML IJ SOLN
INTRAMUSCULAR | Status: DC | PRN
  Administered 2022-03-23: 4 mg via INTRAVENOUS

## 2022-03-23 MED ORDER — LIDOCAINE-EPINEPHRINE 1 %-1:100000 IJ SOLN
INTRAMUSCULAR | Status: DC | PRN
  Administered 2022-03-23: 5 mL

## 2022-03-23 MED ORDER — GLYCOPYRROLATE PF 0.2 MG/ML IJ SOSY
PREFILLED_SYRINGE | INTRAMUSCULAR | Status: DC | PRN
  Administered 2022-03-23: .2 mg via INTRAVENOUS

## 2022-03-23 MED ORDER — MIDAZOLAM HCL 2 MG/2ML IJ SOLN
INTRAMUSCULAR | Status: DC | PRN
  Administered 2022-03-23: 2 mg via INTRAVENOUS

## 2022-03-23 MED ORDER — PROPOFOL 10 MG/ML IV BOLUS
INTRAVENOUS | Status: DC | PRN
  Administered 2022-03-23: 50 mg via INTRAVENOUS
  Administered 2022-03-23: 200 mg via INTRAVENOUS

## 2022-03-23 MED ORDER — FENTANYL CITRATE (PF) 250 MCG/5ML IJ SOLN
INTRAMUSCULAR | Status: AC
  Filled 2022-03-23: qty 5

## 2022-03-23 MED ORDER — LACTATED RINGERS IV SOLN: INTRAVENOUS | Status: DC

## 2022-03-23 MED ORDER — FENTANYL CITRATE (PF) 250 MCG/5ML IJ SOLN
INTRAMUSCULAR | Status: DC | PRN
  Administered 2022-03-23: 50 ug via INTRAVENOUS
  Administered 2022-03-23: 100 ug via INTRAVENOUS
  Administered 2022-03-23: 50 ug via INTRAVENOUS

## 2022-03-23 MED ORDER — ORAL CARE MOUTH RINSE: 15.0000 mL | Freq: Once | OROMUCOSAL | Status: AC

## 2022-03-23 MED ORDER — FENTANYL CITRATE (PF) 100 MCG/2ML IJ SOLN: 25.0000 ug | INTRAMUSCULAR | Status: DC | PRN

## 2022-03-23 MED ORDER — DEXAMETHASONE SODIUM PHOSPHATE 10 MG/ML IJ SOLN
INTRAMUSCULAR | Status: AC
  Filled 2022-03-23: qty 1

## 2022-03-23 MED ORDER — LIDOCAINE-EPINEPHRINE 1 %-1:100000 IJ SOLN
INTRAMUSCULAR | Status: AC
  Filled 2022-03-23: qty 1

## 2022-03-23 MED ORDER — PHENYLEPHRINE 80 MCG/ML (10ML) SYRINGE FOR IV PUSH (FOR BLOOD PRESSURE SUPPORT)
PREFILLED_SYRINGE | INTRAVENOUS | Status: DC | PRN
  Administered 2022-03-23: 80 ug via INTRAVENOUS
  Administered 2022-03-23: 40 ug via INTRAVENOUS
  Administered 2022-03-23: 80 ug via INTRAVENOUS
  Administered 2022-03-23: 40 ug via INTRAVENOUS
  Administered 2022-03-23 (×7): 80 ug via INTRAVENOUS
  Administered 2022-03-23 (×2): 40 ug via INTRAVENOUS

## 2022-03-23 SURGICAL SUPPLY — 64 items
ADH SKN CLS APL DERMABOND .7 (GAUZE/BANDAGES/DRESSINGS) ×1
BAG COUNTER SPONGE SURGICOUNT (BAG) ×3 IMPLANT
BAG DECANTER FOR FLEXI CONT (MISCELLANEOUS) ×3 IMPLANT
BAG SPNG CNTER NS LX DISP (BAG) ×1
BLADE CLIPPER SURG (BLADE) IMPLANT
BLADE SURG 15 STRL LF DISP TIS (BLADE) ×6 IMPLANT
BLADE SURG 15 STRL SS (BLADE) ×6
CANISTER SUCT 3000ML PPV (MISCELLANEOUS) ×3 IMPLANT
CORD BIPOLAR FORCEPS 12FT (ELECTRODE) ×3 IMPLANT
COVER PROBE W GEL 5X96 (DRAPES) ×3 IMPLANT
COVER SURGICAL LIGHT HANDLE (MISCELLANEOUS) ×3 IMPLANT
DERMABOND ADVANCED (GAUZE/BANDAGES/DRESSINGS) ×1
DERMABOND ADVANCED .7 DNX12 (GAUZE/BANDAGES/DRESSINGS) ×4 IMPLANT
DRAPE C-ARM 35X43 STRL (DRAPES) ×3 IMPLANT
DRAPE HEAD BAR (DRAPES) ×3 IMPLANT
DRAPE INCISE IOBAN 66X45 STRL (DRAPES) ×3 IMPLANT
DRAPE MICROSCOPE LEICA 54X105 (DRAPES) ×3 IMPLANT
DRAPE UTILITY XL STRL (DRAPES) ×3 IMPLANT
DRSG TEGADERM 2-3/8X2-3/4 SM (GAUZE/BANDAGES/DRESSINGS) ×6 IMPLANT
DRSG TEGADERM 4X4.75 (GAUZE/BANDAGES/DRESSINGS) ×2 IMPLANT
ELECT COATED BLADE 2.86 ST (ELECTRODE) ×3 IMPLANT
ELECT EMG 18 NIMS (NEUROSURGERY SUPPLIES) ×2
ELECTRODE EMG 18 NIMS (NEUROSURGERY SUPPLIES) ×2 IMPLANT
FORCEPS BIPOLAR SPETZLER 8 1.0 (NEUROSURGERY SUPPLIES) ×3 IMPLANT
GAUZE 4X4 16PLY ~~LOC~~+RFID DBL (SPONGE) ×3 IMPLANT
GAUZE SPONGE 4X4 12PLY STRL (GAUZE/BANDAGES/DRESSINGS) ×3 IMPLANT
GENERATOR PULSE INSPIRE (Generator) ×2 IMPLANT
GENERATOR PULSE INSPIRE IV (Generator) ×2 IMPLANT
GLOVE BIOGEL M 7.0 STRL (GLOVE) ×3 IMPLANT
GOWN STRL REUS W/ TWL LRG LVL3 (GOWN DISPOSABLE) ×4 IMPLANT
GOWN STRL REUS W/TWL LRG LVL3 (GOWN DISPOSABLE) ×4
KIT BASIN OR (CUSTOM PROCEDURE TRAY) ×3 IMPLANT
KIT TURNOVER KIT B (KITS) ×3 IMPLANT
LEAD SENSING RESP INSPIRE (Lead) ×2 IMPLANT
LEAD SENSING RESP INSPIRE IV (Lead) ×2 IMPLANT
LEAD SLEEP STIM INSPIRE IV/V (Lead) ×2 IMPLANT
LEAD SLEEP STIMULATION INSPIRE (Lead) ×2 IMPLANT
LOOP VESSEL MAXI BLUE (MISCELLANEOUS) ×3 IMPLANT
LOOP VESSEL MINI RED (MISCELLANEOUS) ×3 IMPLANT
MARKER SKIN DUAL TIP RULER LAB (MISCELLANEOUS) ×6 IMPLANT
NDL HYPO 25GX1X1/2 BEV (NEEDLE) ×2 IMPLANT
NEEDLE HYPO 25GX1X1/2 BEV (NEEDLE) ×2 IMPLANT
NS IRRIG 1000ML POUR BTL (IV SOLUTION) ×3 IMPLANT
PAD ARMBOARD 7.5X6 YLW CONV (MISCELLANEOUS) ×3 IMPLANT
PASSER CATH 38CM DISP (INSTRUMENTS) ×3 IMPLANT
PENCIL SMOKE EVACUATOR (MISCELLANEOUS) ×3 IMPLANT
POSITIONER HEAD DONUT 9IN (MISCELLANEOUS) ×3 IMPLANT
PROBE NERVE STIMULATOR (NEUROSURGERY SUPPLIES) ×3 IMPLANT
REMOTE CONTROL SLEEP INSPIRE (MISCELLANEOUS) ×3 IMPLANT
SET WALTER ACTIVATION W/DRAPE (SET/KITS/TRAYS/PACK) ×3 IMPLANT
SPONGE INTESTINAL PEANUT (DISPOSABLE) ×3 IMPLANT
STAPLER VISISTAT 35W (STAPLE) ×3 IMPLANT
SUT SILK 2 0 SH (SUTURE) ×6 IMPLANT
SUT SILK 3 0 REEL (SUTURE) ×3 IMPLANT
SUT SILK 3-0 (SUTURE) ×4
SUT SILK 3-0 RB1 30XBRD (SUTURE) ×2
SUT VIC AB 4-0 PS2 27 (SUTURE) ×6 IMPLANT
SUT VIC AB 5-0 P-3 18XBRD (SUTURE) ×4 IMPLANT
SUT VIC AB 5-0 P3 18 (SUTURE) ×4
SUTURE SILK 3-0 RB1 30XBRD (SUTURE) ×4 IMPLANT
SYR 10ML LL (SYRINGE) ×3 IMPLANT
TAPE CLOTH SURG 4X10 WHT LF (GAUZE/BANDAGES/DRESSINGS) IMPLANT
TOWEL GREEN STERILE (TOWEL DISPOSABLE) ×3 IMPLANT
TRAY ENT MC OR (CUSTOM PROCEDURE TRAY) ×3 IMPLANT

## 2022-03-23 NOTE — Anesthesia Postprocedure Evaluation (Signed)
Anesthesia Post Note ? ?Patient: Chase [place] ? ?Procedure(s) Performed: IMPLANTATION OF HYPOGLOSSAL NERVE STIMULATOR (Right: Neck) ? ?  ? ?Patient location during evaluation: PACU ?Anesthesia Type: General ?Level of consciousness: awake and alert ?Pain management: pain level controlled ?Vital Signs Assessment: post-procedure vital signs reviewed and stable ?Respiratory status: spontaneous breathing, nonlabored ventilation, respiratory function stable and patient connected to nasal cannula oxygen ?Cardiovascular status: blood pressure returned to baseline and stable ?Postop Assessment: no apparent nausea or vomiting ?Anesthetic complications: no ? ? ?No notable events documented. ? ?Last Vitals:  ?Vitals:  ? 03/23/22 1020 03/23/22 1035  ?BP: 134/82 (!) 141/86  ?Pulse: 90 91  ?Resp: 12 12  ?Temp:  36.7 ?C  ?SpO2: 100% 100%  ?  ?Last Pain:  ?Vitals:  ? 03/23/22 1035  ?TempSrc:   ?PainSc: 3   ? ? ?  ?  ?  ?  ?  ?  ? ?Collene Schlichter ? ? ? ? ?

## 2022-03-23 NOTE — H&P (Signed)
Chase [place] is an 56 y.o. male.   ?Chief Complaint: OSA ?HPI: Pt with OSA, unable to tol CPAP ? ?Past Medical History:  ?Diagnosis Date  ? Arthritis   ? psoriatic  ? Deviated septum   ? GERD (gastroesophageal reflux disease)   ? Hernia, umbilical 2015  ? Sleep apnea   ? ? ?Past Surgical History:  ?Procedure Laterality Date  ? DRUG INDUCED ENDOSCOPY N/A 02/13/2022  ? Procedure: DRUG INDUCED SLEEP ENDOSCOPY;  Surgeon: Osborn Coho, MD;  Location: [facility];  Service: ENT;  Laterality: N/A;  ? HERNIA REPAIR  2015  ? NASAL SEPTUM SURGERY  20 yrs ago  ? WISDOM TOOTH EXTRACTION Bilateral   ? ? ?Family History  ?Problem Relation Age of Onset  ? Arthritis Mother   ? Sleep apnea Mother   ? ?Social History:  reports that he has never smoked. He has never used smokeless tobacco. He reports current alcohol use of about 6.0 standard drinks per week. He reports that he does not use drugs. ? ?Allergies: No Known Allergies ? ?Facility-Administered Medications Prior to Admission  ?Medication Dose Route Frequency Provider Last Rate Last Admin  ? hydrocortisone 1 % lotion   Topical Daily Janalyn Harder, MD      ? ?Medications Prior to Admission  ?Medication Sig Dispense Refill  ? cetirizine (ZYRTEC) 10 MG tablet Take 10 mg by mouth daily.    ? clobetasol (OLUX) 0.05 % topical foam Apply topically daily. APPLY TO THE SCALP DAILY (Patient taking differently: Apply 1 application. topically daily as needed (scalp).) 60 g 5  ? ibuprofen (ADVIL) 200 MG tablet Take 400-600 mg by mouth every 6 (six) hours as needed for mild pain or moderate pain.    ? Probiotic Product (PROBIOTIC DAILY PO) Take 1 tablet by mouth daily.    ? Risankizumab-rzaa (SKYRIZI PEN) 150 MG/ML SOAJ Inject 150 mg into the skin every 3 (three) months.    ? clobetasol (TEMOVATE) 0.05 % external solution Apply 1 application topically 2 (two) times daily. (Patient not taking: Reported on 03/13/2022) 50 mL 2  ? ? ?No results found for this or any previous  visit (from the past 48 hour(s)). ?No results found. ? ?Review of Systems  ?Respiratory:  Positive for apnea.   ? ?Blood pressure (!) 159/92, pulse 63, temperature 98.1 ?F (36.7 ?C), temperature source Oral, resp. rate 18, height 5\' 10"  (1.778 m), weight 81.6 kg, SpO2 100 %. ?Physical Exam ?Constitutional:   ?   Appearance: He is normal weight.  ?Cardiovascular:  ?   Rate and Rhythm: Normal rate.  ?Pulmonary:  ?   Effort: Pulmonary effort is normal.  ?Musculoskeletal:  ?   Cervical back: Normal range of motion.  ?Neurological:  ?   Mental Status: He is alert.  ?  ? ?Assessment/Plan ?Adm for OP hypoglossal nerve stimulator ? ? , MD ?03/23/2022, 7:30 AM ? ? ? ?

## 2022-03-23 NOTE — Op Note (Signed)
Operative Note: INSPIRE IMPLANT ? ?Patient: Chase [place] ? ?Medical record number: [id number] ? ?Date:03/23/2022 ? ?Pre-operative Indications: Moderate/severe obstructive sleep apnea with positive airway pressure intolerance ? ?Postoperative Indications: Same ? ?Surgical Procedure:  1.  12th cranial nerve (hypoglossal) stimulation implant ?   2.  Placement of chest wall respiratory sensor ?   3.  Electronic analysis of implanted neurostimulator with pulse generator system ? ?Anesthesia: GET ? ?Surgeon: Barbee Cough, M.D. ? ?Assist: Beckey Rutter, PA ? ?PA Nordhbladh's assistance was required throughout the surgical procedure including surgical planning, retraction, management of bleeding and surgical decision-making throughout the operation.  ? ?BMI: 25.8 kg per metered square ? ?Signs and symptoms of the patient's obstructive sleep apnea include poor sleep quality, daytime fatigue and hypersomnolence and inability to tolerate CPAP.  Indications for hypoglossal nerve stimulator are significant obstructive sleep apnea with inability to tolerate medical treatment. ? ?Complications: None ? ?EBL: 50 cc ? ? ?Brief History: ?The patient is a 56 y.o. male with a history of moderate/severe obstructive sleep apnea.  The patient has undergone work-up including sleep study and trial of CPAP which they did not tolerate.  Patient is unable to use long-term CPAP for control of obstructive sleep apnea.  Patient underwent DISE which showed anterior to posterior airway obstruction, considered a good candidate for Inspire Implant. Given the patient's history and findings, I recommended Inspire Implant under general anesthesia, risks and benefits were discussed in detail with the patient and family. They understand and agree with our plan for surgery which is scheduled at [facility] in [place] on an elective basis. ? ?Surgical Procedure: ?The patient is brought to the operating room on 03/23/2022 and placed in supine position on the  operating table. General endotracheal anesthesia was established without difficulty. When the patient was adequately anesthetized, surgical timeout was performed and correct identification of the patient and the surgical procedure. The patient was injected with 5 cc of 1% lidocaine 1:100,000 dilution epinephrine in subcutaneous fashion in the proposed skin incisions.  The patient was positioned and prepped and draped in sterile fashion.  The NIMS monitoring system was positioned and electrodes were placed in the anterior floor of mouth and tongue to assess the genioglossus and styloglossus muscle groups.  Nerve monitoring was used throughout the surgical procedure. ? ?The procedure was begun by creating a modified right submandibular incision in the upper anterior lateral neck ~2 cm below the mandible and in a natural skin crease.  The incision was carried through the skin and underlying subcutaneous tissue to the level of the platysma.  Platysma muscle was divided and subplatysmal flaps were elevated superiorly and inferiorly.  The submandibular space was entered and the submandibular gland was identified and retracted posteriorly.  The digastric tendon was identified and dissection was carried out anterior and posterior along the tendon and muscle bellies.  The mylohyoid muscle was identified and retracted anteriorly and the hypoglossal nerve was carefully dissected across the anterior floor of the submandibular space.  Lateral branches to the retrusor muscles were identified and tested intraoperatively using the NIMS stimulator.  Stimulation electrode cuff for the hypoglossal nerve stimulator was placed distal to these branches on the medial hypoglossal nerve branch innervating the genioglossus muscle.  Diagnostic evaluation confirmed activation of the genioglossus muscle, resulting in genioglossal activation and tongue protrusion which was visually confirmed in the operating room.  The stimulation electrode was   sutured to the digastric tendon with interrupted 4-0 silk sutures. ? ?A second second  incision was created in the anterior upper right chest wall overlying the second rib interspace.  Incision was carried through the skin and underlying subcutaneous tissue to the level of the pectoralis major muscle.  The IPG pocket was created deep to the subcutaneous layer and superficial to the pectoralis muscle.  Placement of the stimulation lead was then undertaken by gentle dissection through the pectoralis major muscle parallel to the muscle fibers overlying the second rib interspace.  The subpectoral fat plane was then divided bluntly and the medial border of the external intercostal muscle was identified.  1 cm posterior to the medial border, dissection was carried through the external intercostal and a plane was developed in the second rib interspace.  The sensor lead was then tunneled into the second rib interspace and sutured with 3-0 silk suture to secure it in proper orientation with the sensing probe facing the pleural space.  The pectoralis major muscle dissection was then brought back into its normal anatomic position and the sense lead was brought out into the subcutaneous pocket. ? ?The lead for the stimulating electrode was then tunneled in a subplatysmal fashion from the submandibular incision to the anterior chest wall incision.  The stimulating electrode and respiration sensing lead were connected to the implantable pulse generator.  Diagnostic evaluation was run confirming respiratory signal and good tongue protrusion on stimulation. ? ?The implantable pulse generator was then placed in the subclavicular pocket and sutured to the pectoralis fascia with 2-0 silk sutures sutures.  The incisions were thoroughly irrigated with bacitracin saline irrigation.  The incisions were then closed in multiple layers with 4-0 and 5-0 Vicryl interrupted sutures in the deep and superficial subcutaneous levels at each incision  site.  Dermabond surgical glue was used for skin closure.  The patient's incisions were dressed with rolled gauze and Hypafix tape for pressure dressing.   ? ?An orogastric tube was passed and stomach contents were aspirated. Patient was awakened from anesthetic and transferred from the operating room to the recovery room in stable condition. There were no complications and blood loss was minimal.  X-rays were obtained in the recovery room to assess the proper location of the pulse generator, sensor lead and stimulation lead. ? ? ?Barbee Cough, M.D. ?[facility] ?03/23/2022  ?

## 2022-03-23 NOTE — Transfer of Care (Signed)
Immediate Anesthesia Transfer of Care Note ? ?Patient: Chase Oneal ? ?Procedure(s) Performed: IMPLANTATION OF HYPOGLOSSAL NERVE STIMULATOR (Right: Neck) ? ?Patient Location: PACU ? ?Anesthesia Type:General ? ?Level of Consciousness: drowsy, patient cooperative and responds to stimulation ? ?Airway & Oxygen Therapy: Patient Spontanous Breathing and Patient connected to nasal cannula oxygen ? ?Post-op Assessment: Report given to RN and Post -op Vital signs reviewed and stable ? ?Post vital signs: Reviewed and stable ? ?Last Vitals:  ?Vitals Value Taken Time  ?BP 150/82 03/23/22 1002  ?Temp    ?Pulse 89 03/23/22 1003  ?Resp 12 03/23/22 1003  ?SpO2 97 % 03/23/22 1003  ?Vitals shown include unvalidated device data. ? ?Last Pain:  ?Vitals:  ? 03/23/22 0616  ?TempSrc:   ?PainSc: 0-No pain  ?   ? ?  ? ?Complications: No notable events documented. ?

## 2022-03-23 NOTE — Anesthesia Procedure Notes (Signed)
Procedure Name: Intubation ?Date/Time: 03/23/2022 7:44 AM ?Performed by: Shary Decamp, CRNA ?Pre-anesthesia Checklist: Patient identified, Patient being monitored, Timeout performed, Emergency Drugs available and Suction available ?Patient Re-evaluated:Patient Re-evaluated prior to induction ?Oxygen Delivery Method: Circle System Utilized ?Preoxygenation: Pre-oxygenation with 100% oxygen ?Induction Type: IV induction ?Ventilation: Mask ventilation without difficulty ?Laryngoscope Size: Hyacinth Meeker and 2 ?Grade View: Grade I ?Tube type: Oral ?Tube size: 7.5 mm ?Number of attempts: 1 ?Airway Equipment and Method: Stylet ?Placement Confirmation: ETT inserted through vocal cords under direct vision, positive ETCO2 and breath sounds checked- equal and bilateral ?Secured at: 22 cm ?Tube secured with: Tape ?Dental Injury: Teeth and Oropharynx as per pre-operative assessment  ? ? ? ? ?

## 2022-03-26 ENCOUNTER — Encounter (HOSPITAL_COMMUNITY): Payer: Self-pay | Admitting: Otolaryngology

## 2022-04-26 ENCOUNTER — Encounter: Payer: Self-pay | Admitting: Pulmonary Disease

## 2022-04-26 ENCOUNTER — Ambulatory Visit (INDEPENDENT_AMBULATORY_CARE_PROVIDER_SITE_OTHER): Payer: 59 | Admitting: Pulmonary Disease

## 2022-04-26 VITALS — BP 128/78 | HR 65 | Ht 70.0 in | Wt 174.0 lb

## 2022-04-26 DIAGNOSIS — G4733 Obstructive sleep apnea (adult) (pediatric): Secondary | ICD-10-CM

## 2022-04-26 NOTE — Progress Notes (Signed)
Chase One[place]    [id number]    [birth year]  Primary Care Physician:[facility], [name]  Referring Physician: Group, [place] [address] [place],  [place] [address]  Chief complaint:   Patient with severe obstructive sleep apnea, in for inspire device activation  HPI:  Sleep apnea diagnosed in 2017, failed CPAP treatment Failed an oral device Had a repeat sleep study performed January 2023 with an AHI in the 40s Had drug-induced endoscopy 02/13/2022 Had inspire device implanted 03/23/2022 In for inspire device activation.  Has been doing well since implantation Did have a postsurgical cough and sore throat which was easily managed, did have some postimplantation fatigue also improvement Denies any strange sensation around his neck or chest Denies significant pain or discomfort  Denies any local symptoms around implantation site  Has been sleeping fairly well  Getting back to his usual activities  Wound site treated with bananas, cream  Patient was met today by myself, inspire activation team  His sleep routine is largely unchanged Tries to get about 8 to 9 hours of sleep every night Usually falls asleep in a short period of time less than 10 to 15 minutes States sometimes it takes him a bit longer but usually able to fall asleep easily Usually about 1 awakening to use the bathroom  Denies any swallowing problems Denies any problems with his speech  Outpatient Encounter Medications as of 04/26/2022  Medication Sig  . cetirizine (ZYRTEC) 10 MG tablet Take 10 mg by mouth daily.  . clobetasol (OLUX) 0.05 % topical foam Apply topically daily. APPLY TO THE SCALP DAILY (Patient taking differently: Apply 1 application. topically daily as needed (scalp).)  . HYDROcodone-acetaminophen (NORCO) 5-325 MG tablet 1 to 2 tablets every 12 hours as needed for pain, alternate every 6 hours with ibuprofen 600 mg.  May substitute Tylenol 650 mg for narcotic.  Marland Kitchen  ibuprofen (ADVIL) 200 MG tablet Take 400-600 mg by mouth every 6 (six) hours as needed for mild pain or moderate pain.  . Probiotic Product (PROBIOTIC DAILY PO) Take 1 tablet by mouth daily.  . Risankizumab-rzaa (SKYRIZI PEN) 150 MG/ML SOAJ Inject 150 mg into the skin every 3 (three) months.  . [DISCONTINUED] clobetasol (TEMOVATE) 0.05 % external solution Apply 1 application topically 2 (two) times daily. (Patient not taking: Reported on 03/13/2022)   Facility-Administered Encounter Medications as of 04/26/2022  Medication  . hydrocortisone 1 % lotion    Allergies as of 04/26/2022  . (No Known Allergies)    Past Medical History:  Diagnosis Date  . Arthritis    psoriatic  . Deviated septum   . GERD (gastroesophageal reflux disease)   . Hernia, umbilical 6644  . Sleep apnea     Past Surgical History:  Procedure Laterality Date  . DRUG INDUCED ENDOSCOPY N/A 02/13/2022   Procedure: DRUG INDUCED SLEEP ENDOSCOPY;  Surgeon: Jerrell Belfast, MD;  Location: [facility];  Service: ENT;  Laterality: N/A;  . HERNIA REPAIR  2015  . IMPLANTATION OF HYPOGLOSSAL NERVE STIMULATOR Right 03/23/2022   Procedure: IMPLANTATION OF HYPOGLOSSAL NERVE STIMULATOR;  Surgeon: Jerrell Belfast, MD;  Location: [facility];  Service: ENT;  Laterality: Right;  . NASAL SEPTUM SURGERY  20 yrs ago  . WISDOM TOOTH EXTRACTION Bilateral     Family History  Problem Relation Age of Onset  . Arthritis Mother   . Sleep apnea Mother     Social History   Socioeconomic History  .  Marital status: Divorced    Spouse name: Not on file  . Number of children: 1  . Years of education: Not on file  . Highest education level: Not on file  Occupational History  . Occupation: owns a [employer], [employer]  Tobacco Use  . Smoking status: Never  . Smokeless tobacco: Never  Vaping Use  . Vaping Use: Never used  Substance and Sexual Activity  . Alcohol use: Yes    Alcohol/week: 6.0 standard drinks    Types: 6 Glasses  of wine per week    Comment: weekly  . Drug use: No  . Sexual activity: Yes  Other Topics Concern  . Not on file  Social History Narrative   Regular exercise-yes   Divorced   1 child   Lives with sig other   [place] travel to [place] late Jan 2017 for 7 nights   Social Determinants of Health   Financial Resource Strain: Not on file  Food Insecurity: Not on file  Transportation Needs: Not on file  Physical Activity: Not on file  Stress: Not on file  Social Connections: Not on file  Intimate Partner Violence: Not on file    Review of Systems  Constitutional:  Negative for fatigue.  Respiratory:  Negative for shortness of breath.   Psychiatric/Behavioral:  Positive for sleep disturbance.    Vitals:   04/26/22 1526  BP: 128/78  Pulse: 65  SpO2: 97%     Physical Exam Constitutional:      Appearance: He is obese.  HENT:     Head: Normocephalic.     Nose: No congestion.     Mouth/Throat:     Mouth: Mucous membranes are moist.     Comments: Tongue is midline, protrudes tongue easily with no abnormal sensation Cardiovascular:     Rate and Rhythm: Normal rate and regular rhythm.     Heart sounds: No murmur heard.   No friction rub.  Pulmonary:     Effort: No respiratory distress.     Breath sounds: No stridor. No wheezing or rhonchi.  Musculoskeletal:     Cervical back: No rigidity or tenderness.  Neurological:     Mental Status: He is alert.  Psychiatric:        Mood and Affect: Mood normal.   Data Reviewed: Sleep study from 04/18/2016 did reveal severe obstructive sleep apnea with an AHI of 55  Repeat sleep study in January 2023 with an AHI in the 40s-has documented in notes from ENT  Assessment:  Severe obstructive sleep apnea  Post inspire device implantation  Inspire device activation today  -  Plan/Recommendations: ***   Sherrilyn Rist MD [facility] 04/26/2022, 4:19 PM  CC:  Group, [place]

## 2022-04-26 NOTE — Patient Instructions (Addendum)
Was nice meeting you today for your inspire device activation  Your next visit will be in about 4 weeks 05/30/2022 at 130 -This is just to check that you have been advancing therapy -You are not having any side effects -You are sleeping well  You on the level 1, 0.3 V You are allowed to go 1 level up per week Start delay of 30 minutes Pause time of 15 minutes Sleep time 9 hours  Call with significant concerns

## 2022-04-27 ENCOUNTER — Other Ambulatory Visit: Payer: Self-pay | Admitting: Adult Health

## 2022-05-07 ENCOUNTER — Other Ambulatory Visit: Payer: Self-pay | Admitting: Pulmonary Disease

## 2022-05-08 ENCOUNTER — Encounter: Payer: Self-pay | Admitting: Pulmonary Disease

## 2022-05-22 ENCOUNTER — Telehealth: Payer: Self-pay | Admitting: Pulmonary Disease

## 2022-05-22 NOTE — Telephone Encounter (Signed)
Pt has been rescheduled to 6/22 at 9 AM with Dr. Wynona Neat.  Nothing further needed at this time.

## 2022-05-24 ENCOUNTER — Encounter: Payer: Self-pay | Admitting: Pulmonary Disease

## 2022-05-24 ENCOUNTER — Ambulatory Visit (INDEPENDENT_AMBULATORY_CARE_PROVIDER_SITE_OTHER): Payer: 59 | Admitting: Pulmonary Disease

## 2022-05-24 ENCOUNTER — Other Ambulatory Visit: Payer: Self-pay | Admitting: Pulmonary Disease

## 2022-05-24 VITALS — BP 120/80 | HR 67 | Temp 97.8°F | Ht 70.0 in | Wt 175.8 lb

## 2022-05-24 DIAGNOSIS — G4733 Obstructive sleep apnea (adult) (pediatric): Secondary | ICD-10-CM

## 2022-05-24 NOTE — Patient Instructions (Signed)
We will see you back in about 4 weeks  Continue to try and go up on your voltage on a weekly basis as tolerated -No pressure to go to a higher level once you find comfortable levels that seems to work best for you -Sleeping well -Waking up feeling like you had a good nights rest -No pain or discomfort  -Always remember to back down to a lower voltage if it becomes uncomfortable  Plan is to tentatively do your sleep study in about 6 to 8 weeks

## 2022-05-30 ENCOUNTER — Ambulatory Visit: Payer: 59 | Admitting: Pulmonary Disease

## 2022-06-27 ENCOUNTER — Ambulatory Visit (INDEPENDENT_AMBULATORY_CARE_PROVIDER_SITE_OTHER): Payer: 59 | Admitting: Primary Care

## 2022-06-27 ENCOUNTER — Encounter: Payer: Self-pay | Admitting: Primary Care

## 2022-06-27 ENCOUNTER — Ambulatory Visit: Payer: 59 | Admitting: Adult Health

## 2022-06-27 DIAGNOSIS — G4733 Obstructive sleep apnea (adult) (pediatric): Secondary | ICD-10-CM

## 2022-06-27 NOTE — Progress Notes (Signed)
@Patient  ID: , male    DOB: Nov 15, 1966, 56 y.o.   MRN: [id number]  Chief Complaint  Patient presents with   Follow-up    Follow up on Inspire    Referring provider: [facility], [facility]  HPI: 56 year old male, never smoked.  Past medical history significant for struct of sleep apnea.  Previous [facility] pulmonayr encounter: We will see you back in about 4 weeks  Continue to try and go up on your voltage on a weekly basis as tolerated -No pressure to go to a higher level once you find comfortable levels that seems to work best for you -Sleeping well -Waking up feeling like you had a good nights rest -No pain or discomfort   -Always remember to back down to a lower voltage if it becomes uncomfortable   Plan is to tentatively do your sleep study in about 6 to 8 weeks  06/27/2022- interim hx  Presents today for follow-up inspire.  Apnea diagnosed in 2017, failed CPAP treatment and oral appliance.  Patient had sleep study in January 2023 showed severe OSA, AHI in the 40s Patient had drug-induced endoscopy on 02/13/2022 Patient had inspire device implanted on 03/23/2022 Inspire device was activated 04/26/2022  He is currently on level 5, he has some discomfort at level 6. Tolerating Inspire device. Reports improvement in sleep quality. Since receiving Inspire device he is sleeping through the night better, not waking up as often gasping or chokng. He still has minimal snoring. Had voice changes initially after activation. He has some minor discomfort at inspire site, ENT aware and patient was ordered for barium swallow which was normal. He gets on average 6 hours of device use a night. Total usage 25/30 days (83%); 64% > 4 hours. Average pauses per night 2.2.   Inspire device download  Total night used 25/30 (83%) Nights used > 4 hours 64% Hours per night 6 hours Avg pauses per night 2.2   Imaging: 03/23/22 DG neck soft tissue >> There is a new nerve stimulator with lead  tip terminating overlying the anterior neck soft tissues in the region of the base of tongue. There is trace postoperative soft tissue gas. The lead appears intact.   No Known Allergies  Immunization History  Administered Date(s) Administered   Td 12/03/2004   Tdap 12/14/2015    Past Medical History:  Diagnosis Date   Arthritis    psoriatic   Deviated septum    GERD (gastroesophageal reflux disease)    Hernia, umbilical 2015   Sleep apnea     Tobacco History: Social History   Tobacco Use  Smoking Status Never  Smokeless Tobacco Never   Counseling given: Not Answered   Outpatient Medications Prior to Visit  Medication Sig Dispense Refill   cetirizine (ZYRTEC) 10 MG tablet Take 10 mg by mouth daily.     clobetasol (OLUX) 0.05 % topical foam Apply topically daily. APPLY TO THE SCALP DAILY (Patient taking differently: Apply 1 application  topically daily as needed (scalp).) 60 g 5   ibuprofen (ADVIL) 200 MG tablet Take 400-600 mg by mouth every 6 (six) hours as needed for mild pain or moderate pain.     Probiotic Product (PROBIOTIC DAILY PO) Take 1 tablet by mouth daily.     Risankizumab-rzaa (SKYRIZI PEN) 150 MG/ML SOAJ Inject 150 mg into the skin every 3 (three) months.     Facility-Administered Medications Prior to Visit  Medication Dose Route Frequency Provider Last Rate Last Admin  hydrocortisone 1 % lotion   Topical Daily Janalyn Harder, MD       Review of Systems  Review of Systems  Constitutional: Negative.   HENT: Negative.    Respiratory: Negative.     Physical Exam  BP (!) 144/86 (BP Location: Right Arm, Patient Position: Sitting, Cuff Size: Normal)   Pulse 85   Temp 98.3 F (36.8 C) (Oral)   Wt 175 lb (79.4 kg)   SpO2 98%   BMI 25.11 kg/m  Physical Exam Constitutional:      Appearance: Normal appearance.  HENT:     Head: Normocephalic and atraumatic.     Mouth/Throat:     Comments: Tongue midline Cardiovascular:     Rate and Rhythm:  Normal rate and regular rhythm.  Pulmonary:     Effort: Pulmonary effort is normal.     Breath sounds: Normal breath sounds.  Musculoskeletal:        General: Normal range of motion.  Skin:    General: Skin is warm and dry.  Neurological:     General: No focal deficit present.     Mental Status: He is alert and oriented to person, place, and time. Mental status is at baseline.  Psychiatric:        Mood and Affect: Mood normal.        Behavior: Behavior normal.        Thought Content: Thought content normal.        Judgment: Judgment normal.      Lab Results:  CBC    Component Value Date/Time   WBC 5.5 03/16/2022 1141   RBC 4.57 03/16/2022 1141   HGB 15.9 03/16/2022 1141   HCT 43.1 03/16/2022 1141   PLT 177 03/16/2022 1141   MCV 94.3 03/16/2022 1141   MCH 34.8 (H) 03/16/2022 1141   MCHC 36.9 (H) 03/16/2022 1141   RDW 11.5 03/16/2022 1141   LYMPHSABS 1.3 08/19/2018 0953   MONOABS 0.7 08/19/2018 0953   EOSABS 0.1 08/19/2018 0953   BASOSABS 0.0 08/19/2018 0953    BMET    Component Value Date/Time   NA 142 08/19/2018 0953   K 4.5 08/19/2018 0953   CL 103 08/19/2018 0953   CO2 27 08/19/2018 0953   GLUCOSE 83 08/19/2018 0953   BUN 20 08/19/2018 0953   CREATININE 1.02 08/19/2018 0953   CALCIUM 9.5 08/19/2018 0953   GFRNONAA 85.75 11/22/2009 1002    BNP No results found for: "BNP"  ProBNP No results found for: "PROBNP"  Imaging: No results found.   Assessment & Plan:   Obstructive sleep apnea - Tolerating Inspire device. He is compliant with use, averaging 83% usage. Sleep duration 6 hours. Patient reports improvement in sleep quality. Currently on level 0.5. Simulation preformed in room without issue. He can advance voltage 0.1 V weekly as tolerates, advised he should never be uncomfortable. He has some chest discomfort at nerve stimulator site since surgery, advised he reach out to Dr. Annalee Genta with ENT.  Due for Inspire titration in August, awaiting sleep  study approval. FU in 4 weeks.      Glenford Bayley, NP 06/27/2022

## 2022-06-27 NOTE — Assessment & Plan Note (Addendum)
-   Tolerating Inspire device. He is compliant with use, averaging 83% usage. Sleep duration 6 hours. Patient reports improvement in sleep quality. Currently on level 0.5. Simulation preformed in room without issue. He can advance voltage 0.1 V weekly as tolerates, advised he should never be uncomfortable. He has some chest discomfort at nerve stimulator site since surgery, advised he reach out to Dr. Annalee Genta with ENT.  Due for Inspire titration in August, awaiting sleep study approval. FU in 4 weeks.

## 2022-06-27 NOTE — Patient Instructions (Signed)
 -   Continue current voltage setting, if able to increased to level 6 you can. You should never be uncomfortable. Always remember to back down to a lower voltage if it becomes uncomfortable  - Plan is to do sleep study in the next month  Follow-up 4 weeks with Dr. Wynona Neat

## 2022-06-28 ENCOUNTER — Other Ambulatory Visit: Payer: Self-pay | Admitting: Primary Care

## 2022-07-25 ENCOUNTER — Ambulatory Visit: Payer: 59 | Admitting: Pulmonary Disease

## 2022-08-22 ENCOUNTER — Ambulatory Visit (HOSPITAL_BASED_OUTPATIENT_CLINIC_OR_DEPARTMENT_OTHER): Payer: 59 | Attending: Pulmonary Disease | Admitting: Pulmonary Disease

## 2022-08-22 DIAGNOSIS — G4733 Obstructive sleep apnea (adult) (pediatric): Secondary | ICD-10-CM | POA: Insufficient documentation

## 2022-08-27 ENCOUNTER — Encounter: Payer: Self-pay | Admitting: Pulmonary Disease

## 2022-09-12 ENCOUNTER — Ambulatory Visit (INDEPENDENT_AMBULATORY_CARE_PROVIDER_SITE_OTHER): Payer: 59 | Admitting: Pulmonary Disease

## 2022-09-12 ENCOUNTER — Encounter: Payer: Self-pay | Admitting: Pulmonary Disease

## 2022-09-12 VITALS — BP 124/78 | HR 73 | Ht 70.0 in | Wt 178.0 lb

## 2022-09-12 DIAGNOSIS — G4733 Obstructive sleep apnea (adult) (pediatric): Secondary | ICD-10-CM | POA: Diagnosis not present

## 2022-09-12 NOTE — Patient Instructions (Signed)
I will see you in 6 months  Optimal voltage for your devices 0.7(level 3)  Ensure you still get adequate number of hours of sleep  Call us with any significant concerns

## 2022-09-12 NOTE — Progress Notes (Signed)
Chase Oneal    [id number]    1966-08-14  Primary Care Physician:[facility], [place]  Referring Physician: Group, [place] [address] [place],  [place] [address]  Chief complaint:   Patient with severe obstructive sleep apnea,  Follow-up for inspire  HPI:   Sleep apnea diagnosed in 2017, failed CPAP treatment Failed an oral device Had a repeat sleep study performed January 2023 with an AHI in the 40s Had drug-induced endoscopy 02/13/2022 Had inspire device implanted 03/23/2022 Device was activated 04/26/2022   doing well since inspire implantation  Gets about 7 hours of sleep every night   recently had Titration study-this showed optimal voltage of 0.7  Higher voltages appear to be associated with awareness of his tongue movement Denies any pain or discomfort No voice changes No swallowing difficulty Wakes up feeling like he is at a good nights rest   New settings amplitude of 0.7 Patient control 0.5-0.9 Start delay of 30 minutes Pause time of 15 minutes Therapy duration of 9 hours  Denies any local symptoms around implantation site  Has no significant activity limitation  Usually falls asleep in a short period of time less than 10 to 15 minutes Hardly ever wakes up during the night May wake up about once  Outpatient Encounter Medications as of 09/12/2022  Medication Sig   cetirizine (ZYRTEC) 10 MG tablet Take 10 mg by mouth daily.   clobetasol (OLUX) 0.05 % topical foam Apply topically daily. APPLY TO THE SCALP DAILY (Patient taking differently: Apply 1 application  topically daily as needed (scalp).)   ibuprofen (ADVIL) 200 MG tablet Take 400-600 mg by mouth every 6 (six) hours as needed for mild pain or moderate pain.   Probiotic Product (PROBIOTIC DAILY PO) Take 1 tablet by mouth daily.   Risankizumab-rzaa (SKYRIZI PEN) 150 MG/ML SOAJ Inject 150 mg into the skin every 3 (three) months.   Facility-Administered Encounter Medications  as of 09/12/2022  Medication   hydrocortisone 1 % lotion    Allergies as of 09/12/2022   (No Known Allergies)    Past Medical History:  Diagnosis Date   Arthritis    psoriatic   Deviated septum    GERD (gastroesophageal reflux disease)    Hernia, umbilical 3875   Sleep apnea     Past Surgical History:  Procedure Laterality Date   DRUG INDUCED ENDOSCOPY N/A 02/13/2022   Procedure: DRUG INDUCED SLEEP ENDOSCOPY;  Surgeon: Jerrell Belfast, MD;  Location: [facility];  Service: ENT;  Laterality: N/A;   HERNIA REPAIR  2015   IMPLANTATION OF HYPOGLOSSAL NERVE STIMULATOR Right 03/23/2022   Procedure: IMPLANTATION OF HYPOGLOSSAL NERVE STIMULATOR;  Surgeon: Jerrell Belfast, MD;  Location: [facility];  Service: ENT;  Laterality: Right;   NASAL SEPTUM SURGERY  20 yrs ago   WISDOM TOOTH EXTRACTION Bilateral     Family History  Problem Relation Age of Onset   Arthritis Mother    Sleep apnea Mother     Social History   Socioeconomic History   Marital status: Divorced    Spouse name: Not on file   Number of children: 1   Years of education: Not on file   Highest education level: Not on file  Occupational History   Occupation: owns a [employer], [employer]  Tobacco Use   Smoking status: Never   Smokeless tobacco: Never  Vaping Use   Vaping Use: Never used  Substance and Sexual Activity   Alcohol use:  Yes    Alcohol/week: 6.0 standard drinks of alcohol    Types: 6 Glasses of wine per week    Comment: weekly   Drug use: No   Sexual activity: Yes  Other Topics Concern   Not on file  Social History Narrative   Regular exercise-yes   Divorced   1 child   Lives with sig other   [employer]   Recent travel to [place] late Jan 2017 for 7 nights   Social Determinants of Health   Financial Resource Strain: Not on file  Food Insecurity: Not on file  Transportation Needs: Not on file  Physical Activity: Not on file  Stress: Not on file  Social  Connections: Not on file  Intimate Partner Violence: Not on file    Review of Systems  Constitutional:  Negative for fatigue.  Respiratory:  Negative for shortness of breath.   Psychiatric/Behavioral:  Positive for sleep disturbance.     Vitals:   09/12/22 1158  BP: 124/78  Pulse: 73  SpO2: 98%      Physical Exam Constitutional:      Appearance: He is obese.  HENT:     Head: Normocephalic.     Nose: No congestion.     Mouth/Throat:     Mouth: Mucous membranes are moist.     Comments: Tongue is midline, protrudes tongue easily with no abnormal sensation Cardiovascular:     Rate and Rhythm: Normal rate and regular rhythm.     Heart sounds: No murmur heard.    No friction rub.  Pulmonary:     Effort: No respiratory distress.     Breath sounds: No stridor. No wheezing or rhonchi.  Musculoskeletal:     Cervical back: No rigidity or tenderness.  Neurological:     Mental Status: He is alert.  Psychiatric:        Mood and Affect: Mood normal.   Data Reviewed: Sleep study from 04/18/2016 did reveal severe obstructive sleep apnea with an AHI of 55  Repeat sleep study in January 2023 with an AHI in the 40s-has documented in notes from ENT  Recent sleep study reviewed with the patient showing not optimal voltage of 0.7  Assessment:  Severe obstructive sleep apnea  Post inspire device implantation  Inspire device titration study revealed an optimal voltage of 0.7  Plan/Recommendations: Settings today  Sleep duration of 9 hours Onset delay of 30 minutes Pause delay of 15 minutes Voltage of 0.7 Patient control will be set at 0.5-0.9  Follow-up appointment for 6 months -She will follow-up with Tammy  Encouraged to give [place] a call with any significant concerns  Encouraged to give [place] a call with any significant concerns  Sherrilyn Rist MD [facility] 09/12/2022, 12:17 PM  CC: Group, [place]

## 2022-09-16 NOTE — Procedures (Signed)
POLYSOMNOGRAPHY  Last, First: Mang, Tailor MRN: [id number] Gender: Male Age (years): 56 Weight (lbs): 178 DOB: 08/18/1966 BMI: 26 Primary Care: Janith Lima Epworth Score: <br> Referring: Delete Delete Technician: Jorge Ny Interpreting: Laurin Coder MD Study Type: Inspire titration Ordered Study Type: Inspire Titration Study date: 08/22/2022 Location: [facility] CLINICAL INFORMATION Alexey Starwalt is a 56 year old Male and was referred to the sleep center for evaluation of G47.30 Sleep Apnea, Unspecified (780.57). Indications include Excessive Daytime Sleepiness, GERD, Hypersomnia (780.54), Hypertension (401.9), OSA.   Most recent polysomnogram dated 04/18/2016 revealed an AHI of 35/h and RDI of 36/h. Most recent titration study dated 04/18/2016 was optimal at 8cm H2O with an AHI of 0/h. MEDICATIONS Patient self administered medications include: N/A. Medications administered during study include No sleep medicine administered.  SLEEP STUDY TECHNIQUE The patient underwent an attended overnight polysomnography titration to assess the effects of Inspire device. The following variables were monitored: EEG(C4-A1, C3-A2, O1-A2, O2-A1), EOG, submental and leg EMG, ECG, oxyhemoglobin saturation by pulse oximetry, thoracic and abdominal respiratory effort belts, nasal/oral airflow by pressure sensor, body position sensor and snoring sensor. CPAP pressure was titrated to eliminate apneas, hypopneas and oxygen desaturation. Hypopneas were scored per AASM definition IB (4% desaturation)  TECHNICIAN COMMENTS Comments added by Technician: None Comments added by Scorer: N/A SLEEP ARCHITECTURE The study was initiated at 10:22:21 PM and terminated at 5:13:49 AM. Total recorded time was 411.5 minutes. EEG confirmed total sleep time was 340 minutes yielding a sleep efficiency of 82.6%%. Sleep onset after lights out was 30.0 minutes with a REM latency of 64.5 minutes. The patient spent 5.3%%  of the night in stage N1 sleep, 63.4%% in stage N2 sleep, 0.0%% in stage N3 and 31.3% in REM. The Arousal Index was 11.5/hour. RESPIRATORY PARAMETERS There were a total of 36 respiratory disturbances out of which 5 were apneas ( 3 obstructive, 0 mixed, 2 central) and 31 hypopneas. The apnea/hypopnea index (AHI) was 6.4 events/hour. The central sleep apnea index was 0.4 events/hour. The REM AHI was 2.3 events/hour and NREM AHI was 8.2 events/hour. The supine AHI was 49.5 events/hour and the non supine AHI was 1.9 supine during 9.26% of sleep. Respiratory disturbances were associated with oxygen desaturation down to a nadir of 85.0% during sleep. The mean oxygen saturation during the study was 94.1%. The cumulative time under 88% oxygen saturation was 5.5 minutes.   LEG MOVEMENT DATA The total leg movements were 66 with a resulting leg movement index of 11.6/hr. Associated arousal with leg movement index was 1.1/hr. CARDIAC DATA The underlying cardiac rhythm was most consistent with sinus rhythm. Mean heart rate during sleep was 66.6 bpm. Additional rhythm abnormalities include None.  IMPRESSIONS - EKG showed no cardiac abnormalities. - The patient snored with loud snoring volume. - Moderate Oxygen Desaturation - Mild Obstructive Sleep apnea(OSA). - No Significant Central Sleep Apnea (CSA) - No significant periodic leg movements(PLMs) during sleep. However, no significant associated arousals. - Reduced sleep efficiency, long primary sleep latency, short REM sleep latency and long slow wave latency.  DIAGNOSIS - Obstructive Sleep Apnea (G47.33)  RECOMMENDATIONS - Recommend a Voltage of 0.7. Optimal sleep achieved at this amplitude. - Avoid alcohol, sedatives and other CNS depressants that may worsen sleep apnea and disrupt normal sleep architecture. - Sleep hygiene should be reviewed to assess factors that may improve sleep quality. - Weight management and regular exercise should be initiated  or continued. - Return to Sleep Center for re-evaluation after 4 weeks of  therapy  [Electronically signed] 09/16/2022 09:20 AM  Sherrilyn Rist MD NPI: [id number]

## 2023-03-14 ENCOUNTER — Ambulatory Visit: Payer: 59 | Admitting: Adult Health

## 2023-03-14 ENCOUNTER — Encounter: Payer: Self-pay | Admitting: Adult Health

## 2023-03-14 VITALS — BP 150/90 | HR 85 | Ht 70.0 in | Wt 184.0 lb

## 2023-03-14 DIAGNOSIS — G4733 Obstructive sleep apnea (adult) (pediatric): Secondary | ICD-10-CM | POA: Diagnosis not present

## 2023-03-14 NOTE — Patient Instructions (Addendum)
Continue on with Inspire each night  Set up home sleep study prior to next visit .  Follow up in 6 months and As needed

## 2023-03-14 NOTE — Progress Notes (Signed)
@Patient  ID: Chase Oneal, male    DOB: May 10, 1966, 57 y.o.   MRN: [id number]  Chief Complaint  Patient presents with   Follow-up    [facility]  HPI: 57 year old male followed for severe sleep apnea, CPAP intolerant, status post inspire device March 23, 2022  TEST/EVENTS :  Sleep study from 04/18/2016 did reveal severe obstructive sleep apnea with an AHI of 55   Repeat sleep study in January 2023 with an AHI in the 40s-has documented in notes from ENT  03/15/2023 Follow up ; OSA w/ Inspire  Patient presents for a 63-month follow-up.  Patient has a known history of severe sleep apnea.  Was CPAP intolerant.  He underwent inspire device implantation March 23, 2022.  He has done very well since inspire was implanted.  His device activation was Apr 26, 2022.  Inspire titration study showed optimal voltage at 0.7 V. Patient returns today and says that he is doing well with inspire.  He says that he occasionally falls asleep on the couch before he gets upstairs so some nights he only gets in about 4 hours of usage.  But tries to use it each night does feel like it decreases his daytime sleepiness.  Current voltage is at 0.7.  With a range of 0.5 to 0.9 V.  We did discuss extending his start time up to 45 minutes to help with increased usage.  He denies any tongue pain or throat pain.  Typically gets in about 6 hours each night.  Inspire download shows 67% usage.  Daily average usage at 6 hours.   No Known Allergies  Immunization History  Administered Date(s) Administered   Td 12/03/2004   Td (Adult), 2 Lf Tetanus Toxid, Preservative Free 12/03/2004   Tdap 12/14/2015    Past Medical History:  Diagnosis Date   Arthritis    psoriatic   Deviated septum    GERD (gastroesophageal reflux disease)    Hernia, umbilical 2015   Sleep apnea     Tobacco History: Social History   Tobacco Use  Smoking Status Never  Smokeless Tobacco Never   Counseling  given: Not Answered   Outpatient Medications Prior to Visit  Medication Sig Dispense Refill   cetirizine (ZYRTEC) 10 MG tablet Take 10 mg by mouth daily.     ibuprofen (ADVIL) 200 MG tablet Take 400-600 mg by mouth every 6 (six) hours as needed for mild pain or moderate pain.     Probiotic Product (PROBIOTIC DAILY PO) Take 1 tablet by mouth daily.     Risankizumab-rzaa (SKYRIZI PEN) 150 MG/ML SOAJ Inject 150 mg into the skin every 3 (three) months.     clobetasol (OLUX) 0.05 % topical foam Apply topically daily. APPLY TO THE SCALP DAILY (Patient not taking: Reported on 03/14/2023) 60 g 5   Facility-Administered Medications Prior to Visit  Medication Dose Route Frequency Provider Last Rate Last Admin   hydrocortisone 1 % lotion   Topical Daily Janalyn Harder, MD         Review of Systems:   Constitutional:   No  weight loss, night sweats,  Fevers, chills, fatigue, or  lassitude.  HEENT:   No headaches,  Difficulty swallowing,  Tooth/dental problems, or  Sore throat,                No sneezing, itching, ear ache, nasal congestion, post nasal drip,   CV:  No chest pain,  Orthopnea, PND, swelling in lower extremities, anasarca, dizziness,  palpitations, syncope.   GI  No heartburn, indigestion, abdominal pain, nausea, vomiting, diarrhea, change in bowel habits, loss of appetite, bloody stools.   Resp: No shortness of breath with exertion or at rest.  No excess mucus, no productive cough,  No non-productive cough,  No coughing up of blood.  No change in color of mucus.  No wheezing.  No chest wall deformity  Skin: no rash or lesions.  GU: no dysuria, change in color of urine, no urgency or frequency.  No flank pain, no hematuria   MS:  No joint pain or swelling.  No decreased range of motion.  No back pain.    Physical Exam  BP (!) 150/90 (BP Location: Left Arm, Patient Position: Sitting, Cuff Size: Normal)   Pulse 85   Ht 5\' 10"  (1.778 m)   Wt 184 lb (83.5 kg)   SpO2 98%   BMI  26.40 kg/m   GEN: A/Ox3; pleasant , NAD, well nourished    HEENT:  [place]/AT,   NOSE-clear, THROAT-clear, no lesions, no postnasal drip or exudate noted.  Tongue is midline with normal movement,  no tremors noted  NECK:  Supple w/ fair ROM; no JVD; normal carotid impulses w/o bruits; no thyromegaly or nodules palpated; no lymphadenopathy.    RESP  Clear  P & A; w/o, wheezes/ rales/ or rhonchi. no accessory muscle use, no dullness to percussion  CARD:  RRR, no m/r/g, no peripheral edema, pulses intact, no cyanosis or clubbing.  GI:   Soft & nt; nml bowel sounds; no organomegaly or masses detected.   Musco: Warm bil, no deformities or joint swelling noted.   Neuro: alert, no focal deficits noted.    Skin: Warm, no lesions or rashes    Lab Results:  CBC   BMET   BNP No results found for: "BNP"  ProBNP No results found for: "PROBNP"  Imaging: No results found.        No data to display          No results found for: "NITRICOXIDE"      Assessment & Plan:   No problem-specific Assessment & Plan notes found for this encounter.     Rubye Oaksammy Robbin Loughmiller, NP 03/14/2023

## 2023-03-15 NOTE — Assessment & Plan Note (Signed)
Severe obstructive sleep apnea CPAP intolerant.  Status post inspire implantation April 2023.  Has done very well.  Patient is continue to use nightly.  Encouraged on daily usage of 6 or more hours.  Will extend start delay to 45 minutes. Will need home sleep study on inspire device prior to next visit  Plan  Patient Instructions  Continue on with Inspire each night  Set up home sleep study prior to next visit .  Follow up in 6 months and As needed

## 2023-08-22 ENCOUNTER — Ambulatory Visit: Payer: 59

## 2023-08-22 DIAGNOSIS — G4733 Obstructive sleep apnea (adult) (pediatric): Secondary | ICD-10-CM

## 2023-09-12 ENCOUNTER — Ambulatory Visit: Payer: 59 | Admitting: Adult Health

## 2023-09-16 ENCOUNTER — Encounter: Payer: Self-pay | Admitting: Adult Health

## 2023-09-17 ENCOUNTER — Ambulatory Visit: Payer: 59 | Admitting: Adult Health

## 2023-09-17 ENCOUNTER — Encounter: Payer: Self-pay | Admitting: Adult Health

## 2023-09-17 ENCOUNTER — Telehealth: Payer: Self-pay | Admitting: Adult Health

## 2023-09-17 VITALS — BP 132/80 | HR 77 | Ht 70.0 in | Wt 174.8 lb

## 2023-09-17 DIAGNOSIS — G4733 Obstructive sleep apnea (adult) (pediatric): Secondary | ICD-10-CM

## 2023-09-17 NOTE — Assessment & Plan Note (Signed)
Severe obstructive sleep apnea CPAP intolerant.  Status post inspire implantation April 2023.  Patient appears to be doing well.  Encouraged on nightly usage.  Continue on current settings.  Will reevaluate in 3 to 4 months.  If pulsations are too strong to decrease by 1 level until more comfortable.  And then can slowly titrate back up if needed.  Plan  Patient Instructions  Continue on with Inspire each night  Follow up in 3-4 months and As needed  -H[place] to call with appointment

## 2023-09-17 NOTE — Telephone Encounter (Signed)
Chase Oneal can you set him up with [facility] team for 3-4 month follow up .

## 2023-09-17 NOTE — Progress Notes (Signed)
@Patient  ID: Chase Oneal, male    DOB: 06-11-1966, 57 y.o.   MRN: [id number]  Chief Complaint  Patient presents with   Follow-up    Referring provider: No ref. provider found  HPI: 57 year old male followed for severe obstructive sleep apnea, CPAP intolerance status post inspire device implantation March 23, 2022  TEST/EVENTS :  Sleep study from 04/18/2016 did reveal severe obstructive sleep apnea with an AHI of 55   Repeat sleep study in January 2023 with an AHI in the 40s-has documented in notes from ENT  Inspire implantation March 23, 2022 Inspire activation Apr 26, 2022 Inspire titration study showed optimal voltage at 0.7 V Movements range 0.5 to 0.9 V Start delay 45 minutes, pause 15, duration 9 hours   09/17/2023 Follow up : OSA  -Inspire device  Patient returns for a 6 month follow up.  Patient has severe obstructive sleep apnea but has been CPAP intolerant.  He is status post inspire device implantation from April 2023.  He is here today to review recent home sleep study results on inspire device.  He since his last visit he is doing fairly well.  His sleep is pretty good.  Snoring has decreased but not totally been eliminated.  Has most nights he uses it each night occasionally follows later on.  Download shows 83% compliance with daily average usage at 6 hours.  Patient denies any speech or swallow aids.  Says occasionally he feels the pulsations are too strong at nighttime but this seems to come and go.  Patient is currently at 0.7 V/level 3.  Start delay is at 45 minutes.  Pause time is at 15 minutes indurations at 9 hours.  Overall feels that he is benefiting and has decreased daytime sleepiness and improved sleep regimen.  Patient had home sleep study with watch pad with inspire device on September 03, 2023 that showed mild obstructive sleep apnea with AHI at 13/hour and no significant oxygen desaturations.  We discussed his sleep study results in detail.  No Known  Allergies  Immunization History  Administered Date(s) Administered   Td 12/03/2004   Td (Adult), 2 Lf Tetanus Toxid, Preservative Free 12/03/2004   Tdap 12/14/2015    Past Medical History:  Diagnosis Date   Arthritis    psoriatic   Deviated septum    GERD (gastroesophageal reflux disease)    Hernia, umbilical 2015   Sleep apnea     Tobacco History: Social History   Tobacco Use  Smoking Status Never  Smokeless Tobacco Never   Counseling given: Not Answered   Outpatient Medications Prior to Visit  Medication Sig Dispense Refill   cetirizine (ZYRTEC) 10 MG tablet Take 10 mg by mouth daily.     ibuprofen (ADVIL) 200 MG tablet Take 400-600 mg by mouth every 6 (six) hours as needed for mild pain or moderate pain.     Probiotic Product (PROBIOTIC DAILY PO) Take 1 tablet by mouth daily.     Risankizumab-rzaa (SKYRIZI PEN) 150 MG/ML SOAJ Inject 150 mg into the skin every 3 (three) months.     Facility-Administered Medications Prior to Visit  Medication Dose Route Frequency Provider Last Rate Last Admin   hydrocortisone 1 % lotion   Topical Daily Janalyn Harder, MD         Review of Systems:   Constitutional:   No  weight loss, night sweats,  Fevers, chills, fatigue, or  lassitude.  HEENT:   No headaches,  Difficulty swallowing,  Tooth/dental problems,  or  Sore throat,                No sneezing, itching, ear ache, nasal congestion, post nasal drip,   CV:  No chest pain,  Orthopnea, PND, swelling in lower extremities, anasarca, dizziness, palpitations, syncope.   GI  No heartburn, indigestion, abdominal pain, nausea, vomiting, diarrhea, change in bowel habits, loss of appetite, bloody stools.   Resp: No shortness of breath with exertion or at rest.  No excess mucus, no productive cough,  No non-productive cough,  No coughing up of blood.  No change in color of mucus.  No wheezing.  No chest wall deformity  Skin: no rash or lesions.  GU: no dysuria, change in color of  urine, no urgency or frequency.  No flank pain, no hematuria   MS:  No joint pain or swelling.  No decreased range of motion.  No back pain.    Physical Exam  BP 132/80 (BP Location: Left Arm, Patient Position: Sitting, Cuff Size: Normal)   Pulse 77   Ht 5\' 10"  (1.778 m)   Wt 174 lb 12.8 oz (79.3 kg)   SpO2 97%   BMI 25.08 kg/m   GEN: A/Ox3; pleasant , NAD, well nourished    HEENT:  [place]/AT, , NOSE-clear, THROAT-clear, no lesions, no postnasal drip or exudate noted. Normal tongue movement   NECK:  Supple w/ fair ROM; no JVD; normal carotid impulses w/o bruits; no thyromegaly or nodules palpated; no lymphadenopathy.    RESP  Clear  P & A; w/o, wheezes/ rales/ or rhonchi. no accessory muscle use, no dullness to percussion  CARD:  RRR, no m/r/g, no peripheral edema, pulses intact, no cyanosis or clubbing.  GI:   Soft & nt; nml bowel sounds; no organomegaly or masses detected.   Musco: Warm bil, no deformities or joint swelling noted.   Neuro: alert, no focal deficits noted.    Skin: Warm, no lesions or rashes    Lab Results:  CBC    Component Value Date/Time   WBC 5.5 03/16/2022 1141   RBC 4.57 03/16/2022 1141   HGB 15.9 03/16/2022 1141   HCT 43.1 03/16/2022 1141   PLT 177 03/16/2022 1141   MCV 94.3 03/16/2022 1141   MCH 34.8 (H) 03/16/2022 1141   MCHC 36.9 (H) 03/16/2022 1141   RDW 11.5 03/16/2022 1141   LYMPHSABS 1.3 08/19/2018 0953   MONOABS 0.7 08/19/2018 0953   EOSABS 0.1 08/19/2018 0953   BASOSABS 0.0 08/19/2018 0953    BMET    Component Value Date/Time   NA 142 08/19/2018 0953   K 4.5 08/19/2018 0953   CL 103 08/19/2018 0953   CO2 27 08/19/2018 0953   GLUCOSE 83 08/19/2018 0953   BUN 20 08/19/2018 0953   CREATININE 1.02 08/19/2018 0953   CALCIUM 9.5 08/19/2018 0953   GFRNONAA 85.75 11/22/2009 1002    BNP No results found for: "BNP"  ProBNP No results found for: "PROBNP"  Imaging: No results found.  Administration History     None            No data to display          No results found for: "NITRICOXIDE"      Assessment & Plan:   No problem-specific Assessment & Plan notes found for this encounter.     Rubye Oaks, NP 09/17/2023

## 2023-09-17 NOTE — Patient Instructions (Signed)
Continue on with Inspire each night  Follow up in 3-4 months and As needed  -Holly to call with appointment

## 2023-12-26 ENCOUNTER — Ambulatory Visit: Payer: 59 | Admitting: Primary Care

## 2023-12-31 ENCOUNTER — Encounter: Payer: Self-pay | Admitting: Adult Health

## 2023-12-31 ENCOUNTER — Ambulatory Visit: Payer: BLUE CROSS/BLUE SHIELD | Admitting: Adult Health

## 2023-12-31 VITALS — BP 136/70 | HR 60 | Ht 69.0 in | Wt 178.6 lb

## 2023-12-31 DIAGNOSIS — G4733 Obstructive sleep apnea (adult) (pediatric): Secondary | ICD-10-CM

## 2023-12-31 NOTE — Progress Notes (Signed)
@Patient  ID: Chase Oneal, male    DOB: 1966-09-06, 58 y.o.   MRN: [id number]  Chief Complaint  Patient presents with   Follow-up    Referring provider: No ref. provider found  HPI: 58 year old male followed for severe obstructive sleep apnea, CPAP intolerance status post inspire device implantation March 23, 2022  TEST/EVENTS :  Sleep study from 04/18/2016 did reveal severe obstructive sleep apnea with an AHI of 55   Repeat sleep study in January 2023 with an AHI in the 40s-has documented in notes from ENT   Inspire implantation March 23, 2022 Inspire activation Apr 26, 2022 Inspire titration study showed optimal voltage at 0.7 V Movements range 0.5 to 0.9 V Start delay 45 minutes, pause 15, duration 9 hours  home sleep study with watch pad with inspire device on September 03, 2023 that showed mild obstructive sleep apnea with AHI at 13/hour and no significant oxygen desaturations.   12/31/2023 Follow up : OSA -Inspire Device  Patient presents for a 62-month follow-up.  Patient has severe obstructive sleep apnea but has been CPAP intolerant.  He is status post inspire device implantation April 2023.  Patient says he is doing well with inspire.  He uses it most nights.  Feels that he is rested with decreased daytime sleepiness.  Denies any tongue issues no difficulty swallowing. Inspire compliance report shows 80% compliance with daily average usage at 6 hours.  Range is 0.5 to 0.9 V.  Current level is at 0.7 V.  Stimulation test shows normal tongue protrusion at current level.        No Known Allergies  Immunization History  Administered Date(s) Administered   Td 12/03/2004   Td (Adult), 2 Lf Tetanus Toxid, Preservative Free 12/03/2004   Tdap 12/14/2015    Past Medical History:  Diagnosis Date   Arthritis    psoriatic   Deviated septum    GERD (gastroesophageal reflux disease)    Hernia, umbilical 2015   Sleep apnea     Tobacco History: Social History   Tobacco  Use  Smoking Status Never  Smokeless Tobacco Never   Counseling given: Not Answered   Outpatient Medications Prior to Visit  Medication Sig Dispense Refill   cetirizine (ZYRTEC) 10 MG tablet Take 10 mg by mouth daily.     ibuprofen (ADVIL) 200 MG tablet Take 400-600 mg by mouth every 6 (six) hours as needed for mild pain or moderate pain.     Probiotic Product (PROBIOTIC DAILY PO) Take 1 tablet by mouth daily.     Risankizumab-rzaa (SKYRIZI PEN) 150 MG/ML SOAJ Inject 150 mg into the skin every 3 (three) months.     Facility-Administered Medications Prior to Visit  Medication Dose Route Frequency Provider Last Rate Last Admin   hydrocortisone 1 % lotion   Topical Daily Janalyn Harder, MD         Review of Systems:   Constitutional:   No  weight loss, night sweats,  Fevers, chills, fatigue, or  lassitude.  HEENT:   No headaches,  Difficulty swallowing,  Tooth/dental problems, or  Sore throat,                No sneezing, itching, ear ache, nasal congestion, post nasal drip,   CV:  No chest pain,  Orthopnea, PND, swelling in lower extremities, anasarca, dizziness, palpitations, syncope.   GI  No heartburn, indigestion, abdominal pain, nausea, vomiting, diarrhea, change in bowel habits, loss of appetite, bloody stools.   Resp: No shortness  of breath with exertion or at rest.  No excess mucus, no productive cough,  No non-productive cough,  No coughing up of blood.  No change in color of mucus.  No wheezing.  No chest wall deformity  Skin: no rash or lesions.  GU: no dysuria, change in color of urine, no urgency or frequency.  No flank pain, no hematuria   MS:  No joint pain or swelling.  No decreased range of motion.  No back pain.    Physical Exam  BP 136/70 (BP Location: Left Arm, Patient Position: Sitting, Cuff Size: Large)   Pulse 60   Ht 5\' 9"  (1.753 m)   Wt 178 lb 9.6 oz (81 kg)   SpO2 98%   BMI 26.37 kg/m   GEN: A/Ox3; pleasant , NAD, well nourished    HEENT:   [place]/AT,  NOSE-clear, THROAT-clear, no lesions, no postnasal drip or exudate noted.  Normal tongue movement.  NECK:  Supple w/ fair ROM; no JVD; normal carotid impulses w/o bruits; no thyromegaly or nodules palpated; no lymphadenopathy.    RESP  Clear  P & A; w/o, wheezes/ rales/ or rhonchi. no accessory muscle use, no dullness to percussion  CARD:  RRR, no m/r/g, no peripheral edema, pulses intact, no cyanosis or clubbing.  GI:   Soft & nt; nml bowel sounds; no organomegaly or masses detected.   Musco: Warm bil, no deformities or joint swelling noted.   Neuro: alert, no focal deficits noted.    Skin: Warm, no lesions or rashes    Lab Results:  CBC  No results found for: "BNP"  ProBNP No results found for: "PROBNP"  Imaging: No results found.  Administration History     None           No data to display          No results found for: "NITRICOXIDE"      Assessment & Plan:   No problem-specific Assessment & Plan notes found for this encounter.     Rubye Oaks, NP 12/31/2023

## 2023-12-31 NOTE — Patient Instructions (Addendum)
Continue on with Inspire each night  Keep up good work.  Follow up in 1 year and As needed  -Holly to call with appointment

## 2024-01-03 NOTE — Assessment & Plan Note (Signed)
Severe obstructive sleep apnea-status post inspire device.  Patient is doing well on inspire.  Has perceived benefit.  Continue on current settings.  Follow-up in 1 year  Plan  Patient Instructions  Continue on with Inspire each night  Keep up good work.  Follow up in 1 year and As needed  -Holly to call with appointment

## 2024-04-07 DIAGNOSIS — H16001 Unspecified corneal ulcer, right eye: Secondary | ICD-10-CM | POA: Diagnosis not present

## 2024-04-20 DIAGNOSIS — Z125 Encounter for screening for malignant neoplasm of prostate: Secondary | ICD-10-CM | POA: Diagnosis not present

## 2024-04-20 DIAGNOSIS — Z1329 Encounter for screening for other suspected endocrine disorder: Secondary | ICD-10-CM | POA: Diagnosis not present

## 2024-04-20 DIAGNOSIS — Z13220 Encounter for screening for lipoid disorders: Secondary | ICD-10-CM | POA: Diagnosis not present

## 2024-04-20 DIAGNOSIS — Z1330 Encounter for screening examination for mental health and behavioral disorders, unspecified: Secondary | ICD-10-CM | POA: Diagnosis not present

## 2024-04-20 DIAGNOSIS — Z1211 Encounter for screening for malignant neoplasm of colon: Secondary | ICD-10-CM | POA: Diagnosis not present

## 2024-04-20 DIAGNOSIS — Z131 Encounter for screening for diabetes mellitus: Secondary | ICD-10-CM | POA: Diagnosis not present

## 2024-04-20 DIAGNOSIS — Z0000 Encounter for general adult medical examination without abnormal findings: Secondary | ICD-10-CM | POA: Diagnosis not present

## 2024-05-19 LAB — COLOGUARD: COLOGUARD: NEGATIVE

## 2024-12-30 ENCOUNTER — Ambulatory Visit (HOSPITAL_BASED_OUTPATIENT_CLINIC_OR_DEPARTMENT_OTHER): Payer: BLUE CROSS/BLUE SHIELD | Admitting: Adult Health

## 2024-12-31 ENCOUNTER — Encounter: Payer: Self-pay | Admitting: Adult Health

## 2024-12-31 ENCOUNTER — Ambulatory Visit: Admitting: Adult Health

## 2024-12-31 VITALS — BP 144/89 | HR 71 | Ht 69.0 in | Wt 170.4 lb

## 2024-12-31 DIAGNOSIS — Z9682 Presence of neurostimulator: Secondary | ICD-10-CM | POA: Diagnosis not present

## 2024-12-31 DIAGNOSIS — G4733 Obstructive sleep apnea (adult) (pediatric): Secondary | ICD-10-CM

## 2024-12-31 NOTE — Patient Instructions (Signed)
 Continue on with Inspire each night, try to use all night long.  We decreased to Level 1, stay on this level for at least 1 week then go to level 2 and hold at this level.  Follow up in 4 weeks -INSPIRE visit (Kianah Harries NP or [place])

## 2024-12-31 NOTE — Progress Notes (Signed)
 "  @Patient  ID: Chase Oneal, male    DOB: 1966-08-06, 59 y.o.   MRN: [id number]  Chief Complaint  Patient presents with   Medical Management of Chronic Issues    inspire    Referring provider: No ref. provider found  HPI: 59 year old male followed for severe obstructive sleep apnea with CPAP intolerance status post inspire device implantation March 23, 2022    TEST/EVENTS : Reviewed 12/31/2024  Sleep study from 04/18/2016 did reveal severe obstructive sleep apnea with an AHI of 55   Repeat sleep study in January 2023 with an AHI in the 40s-has documented in notes from ENT   Inspire implantation March 23, 2022 Inspire activation Apr 26, 2022 Inspire titration study showed optimal voltage at 0.7 V Set range 0.5 to 0.9 V Start delay 45 minutes, pause 15, duration 9 hours   home sleep study with watch pad with inspire device on September 03, 2023 that showed mild obstructive sleep apnea with AHI at 13/hour and no significant oxygen desaturations. (Level 3/O.7V)    01/01/2025 Follow up : OSA/INSPIRE  Patient returns for 1 year follow up. Says he is doing okay on inspire but has noticed over last few months it has been too strong causing jaw discomfort. Decreased to Level 2 few weeks ago (0.6V) which is more comfortable. Even  at this level will keep wake him up or startle him when it turns on awkening him. Stimulation test shows good tongue movement. Denies speech or swallowing issue. Has lost around 8 lbs over last year.  Usage is good with average around 5 hr . Stimulation test at level 3 was uncomfortable. Level 1 and 2 shows good tongue protrusion and comfortable.  Waveform normal . Start time increased to 1hr .     Allergies[1]  Immunization History  Administered Date(s) Administered   Td 12/03/2004   Td (Adult), 2 Lf Tetanus Toxid, Preservative Free 12/03/2004   Tdap 12/14/2015    Past Medical History:  Diagnosis Date   Arthritis    psoriatic   Deviated septum    GERD  (gastroesophageal reflux disease)    Hernia, umbilical 2015   Sleep apnea     Tobacco History: Tobacco Use History[2] Counseling given: Not Answered   Outpatient Medications Prior to Visit  Medication Sig Dispense Refill   cetirizine  (ZYRTEC ) 10 MG tablet Take 10 mg by mouth daily.     ibuprofen (ADVIL) 200 MG tablet Take 400-600 mg by mouth every 6 (six) hours as needed for mild pain or moderate pain.     olmesartan (BENICAR) 20 MG tablet Take 10 mg by mouth daily.     Probiotic Product (PROBIOTIC DAILY PO) Take 1 tablet by mouth daily.     Risankizumab-rzaa (SKYRIZI PEN) 150 MG/ML SOAJ Inject 150 mg into the skin every 3 (three) months.     sildenafil (VIAGRA) 50 MG tablet Take 50 mg by mouth as needed.     AIRSUPRA 90-80 MCG/ACT AERO Inhale 2 puffs into the lungs. (Patient not taking: Reported on 12/31/2024)     fluticasone (FLONASE) 50 MCG/ACT nasal spray Place 2 sprays into the nose daily. (Patient not taking: Reported on 12/31/2024)     Facility-Administered Medications Prior to Visit  Medication Dose Route Frequency Provider Last Rate Last Admin   hydrocortisone  1 % lotion   Topical Daily Livingston Rigg, MD         Review of Systems:   Constitutional:   No  weight loss, night sweats,  Fevers,  chills, fatigue, or  lassitude.  HEENT:   No headaches,  Difficulty swallowing,  Tooth/dental problems, or  Sore throat,                No sneezing, itching, ear ache, nasal congestion, post nasal drip,   CV:  No chest pain,  Orthopnea, PND, swelling in lower extremities, anasarca, dizziness, palpitations, syncope.   GI  No heartburn, indigestion, abdominal pain, nausea, vomiting, diarrhea, change in bowel habits, loss of appetite, bloody stools.   Resp: No shortness of breath with exertion or at rest.  No excess mucus, no productive cough,  No non-productive cough,  No coughing up of blood.  No change in color of mucus.  No wheezing.  No chest wall deformity  Skin: no rash or  lesions.  GU: no dysuria, change in color of urine, no urgency or frequency.  No flank pain, no hematuria   MS:  No joint pain or swelling.  No decreased range of motion.  No back pain.    Physical Exam  BP (!) 151/85   Pulse 71   Ht 5' 9 (1.753 m) Comment: Per pt  Wt 170 lb 6.4 oz (77.3 kg)   SpO2 96% Comment: RA  BMI 25.16 kg/m   GEN: A/Ox3; pleasant , NAD, well nourished  EACs-clear, TMs-wnl, NOSE-clear, THROAT-clear, no lesions, no postnasal drip or exudate noted.   NECK:  Supple w/ fair ROM; no JVD; normal carotid impulses w/o bruits; no thyromegaly or nodules palpated; no lymphadenopathy.    RESP  Clear  P & A; w/o, wheezes/ rales/ or rhonchi. no accessory muscle use, no dullness to percussion  CARD:  RRR, no m/r/g, no peripheral edema, pulses intact, no cyanosis or clubbing.  GI:   Soft & nt; nml bowel sounds; no organomegaly or masses detected.   Musco: Warm bil, no deformities or joint swelling noted.   Neuro: alert, no focal deficits noted.    Skin: Warm, no lesions or rashes    Lab Results:Reviewed 12/31/2024   CBC   BMET  BNP No results found for: BNP  ProBNP No results found for: PROBNP  Imaging: No results found.  Administration History     None           No data to display          No results found for: NITRICOXIDE      No data to display              Assessment & Plan:   Assessment and Plan OSA -severe OSA s/p inspire device . Having trouble with jaw discomfort and intolerance. Advice to decrease to Level 1 (0.5V) for 1 week then increase Level 2 (0.6V) as tolerated. Follow up in 4 weeks , if doing well set up home sleep study on INSPIRE.   Plan  Patient Instructions  Continue on with Inspire each night, try to use all night long.  We decreased to Level 1, stay on this level for at least 1 week then go to level 2 and hold at this level.  Follow up in 4 weeks -INSPIRE visit (Vanden Fawaz NP or [place])          Madelin Stank, NP 12/31/2024  I spent 38  minutes dedicated to the care of this patient on the date of this encounter to include pre-visit review of records, face-to-face time with the patient discussing conditions above, post visit ordering of testing, clinical documentation with the electronic health record, making appropriate  referrals as documented, and communicating necessary findings to members of the patients care team.      [1] No Known Allergies [2]  Social History Tobacco Use  Smoking Status Never  Smokeless Tobacco Never   "

## 2025-01-26 ENCOUNTER — Encounter: Admitting: Adult Health
